# Patient Record
Sex: Female | Born: 1955 | Race: White | Hispanic: No | Smoking: Former smoker
Health system: Southern US, Community
[De-identification: ages and names within clinical notes are randomized; demographics above are authoritative.]

## PROBLEM LIST (undated history)

## (undated) DIAGNOSIS — M549 Dorsalgia, unspecified: Secondary | ICD-10-CM

## (undated) DIAGNOSIS — F419 Anxiety disorder, unspecified: Secondary | ICD-10-CM

## (undated) DIAGNOSIS — R519 Headache, unspecified: Secondary | ICD-10-CM

## (undated) DIAGNOSIS — G4700 Insomnia, unspecified: Secondary | ICD-10-CM

## (undated) DIAGNOSIS — I10 Essential (primary) hypertension: Secondary | ICD-10-CM

## (undated) DIAGNOSIS — I442 Atrioventricular block, complete: Secondary | ICD-10-CM

## (undated) DIAGNOSIS — N939 Abnormal uterine and vaginal bleeding, unspecified: Secondary | ICD-10-CM

## (undated) DIAGNOSIS — Z950 Presence of cardiac pacemaker: Secondary | ICD-10-CM

## (undated) DIAGNOSIS — I480 Paroxysmal atrial fibrillation: Secondary | ICD-10-CM

## (undated) DIAGNOSIS — R0602 Shortness of breath: Secondary | ICD-10-CM

## (undated) DIAGNOSIS — R079 Chest pain, unspecified: Secondary | ICD-10-CM

## (undated) HISTORY — DX: Abnormal uterine and vaginal bleeding, unspecified: N93.9

## (undated) HISTORY — DX: Dorsalgia, unspecified: M54.9

## (undated) HISTORY — DX: Atrioventricular block, complete: I44.2

## (undated) HISTORY — DX: Paroxysmal atrial fibrillation: I48.0

## (undated) HISTORY — DX: Essential (primary) hypertension: I10

## (undated) HISTORY — DX: Headache, unspecified: R51.9

## (undated) HISTORY — DX: Insomnia, unspecified: G47.00

## (undated) HISTORY — DX: Shortness of breath: R06.02

## (undated) HISTORY — DX: Presence of cardiac pacemaker: Z95.0

## (undated) HISTORY — PX: BREAST BIOPSY: SHX20

## (undated) HISTORY — DX: Anxiety disorder, unspecified: F41.9

## (undated) HISTORY — DX: Chest pain, unspecified: R07.9

---

## 1983-05-05 HISTORY — PX: TUBAL LIGATION: SHX77

## 2011-10-20 ENCOUNTER — Other Ambulatory Visit: Payer: Self-pay | Admitting: Family Medicine

## 2011-10-20 DIAGNOSIS — R921 Mammographic calcification found on diagnostic imaging of breast: Secondary | ICD-10-CM

## 2011-10-28 ENCOUNTER — Ambulatory Visit
Admission: RE | Admit: 2011-10-28 | Discharge: 2011-10-28 | Disposition: A | Discharge status: Home / Self Care | Payer: Managed Care, Other (non HMO) | Source: Ambulatory Visit | Attending: Family Medicine | Admitting: Family Medicine

## 2011-10-28 DIAGNOSIS — R921 Mammographic calcification found on diagnostic imaging of breast: Secondary | ICD-10-CM

## 2012-05-02 DIAGNOSIS — J40 Bronchitis, not specified as acute or chronic: Secondary | ICD-10-CM | POA: Insufficient documentation

## 2013-01-10 DIAGNOSIS — I10 Essential (primary) hypertension: Secondary | ICD-10-CM | POA: Insufficient documentation

## 2013-08-16 DIAGNOSIS — R0989 Other specified symptoms and signs involving the circulatory and respiratory systems: Secondary | ICD-10-CM | POA: Insufficient documentation

## 2013-08-16 DIAGNOSIS — G5600 Carpal tunnel syndrome, unspecified upper limb: Secondary | ICD-10-CM | POA: Insufficient documentation

## 2013-08-16 DIAGNOSIS — M722 Plantar fascial fibromatosis: Secondary | ICD-10-CM | POA: Insufficient documentation

## 2014-10-24 DIAGNOSIS — M2550 Pain in unspecified joint: Secondary | ICD-10-CM | POA: Insufficient documentation

## 2015-06-05 HISTORY — PX: CHOLECYSTECTOMY: SHX55

## 2015-10-30 DIAGNOSIS — Z8249 Family history of ischemic heart disease and other diseases of the circulatory system: Secondary | ICD-10-CM | POA: Insufficient documentation

## 2016-01-12 DIAGNOSIS — R1013 Epigastric pain: Secondary | ICD-10-CM | POA: Insufficient documentation

## 2016-01-17 ENCOUNTER — Encounter: Payer: Self-pay | Admitting: Gastroenterology

## 2016-02-12 ENCOUNTER — Ambulatory Visit: Payer: Managed Care, Other (non HMO) | Admitting: Nurse Practitioner

## 2016-09-23 ENCOUNTER — Other Ambulatory Visit: Payer: Self-pay | Admitting: General Surgery

## 2016-09-23 DIAGNOSIS — Z01810 Encounter for preprocedural cardiovascular examination: Secondary | ICD-10-CM

## 2016-10-07 ENCOUNTER — Encounter: Payer: Managed Care, Other (non HMO) | Attending: General Surgery | Admitting: Registered"

## 2016-10-07 ENCOUNTER — Encounter: Payer: Self-pay | Admitting: Registered"

## 2016-10-07 ENCOUNTER — Encounter (HOSPITAL_COMMUNITY)
Admission: RE | Admit: 2016-10-07 | Discharge: 2016-10-07 | Disposition: A | Discharge status: Home / Self Care | Payer: Managed Care, Other (non HMO) | Source: Ambulatory Visit | Attending: General Surgery | Admitting: General Surgery

## 2016-10-07 DIAGNOSIS — E669 Obesity, unspecified: Secondary | ICD-10-CM

## 2016-10-07 DIAGNOSIS — I447 Left bundle-branch block, unspecified: Secondary | ICD-10-CM | POA: Diagnosis not present

## 2016-10-07 DIAGNOSIS — Z713 Dietary counseling and surveillance: Secondary | ICD-10-CM | POA: Diagnosis present

## 2016-10-07 DIAGNOSIS — Z6841 Body Mass Index (BMI) 40.0 and over, adult: Secondary | ICD-10-CM | POA: Insufficient documentation

## 2016-10-07 DIAGNOSIS — Z01810 Encounter for preprocedural cardiovascular examination: Secondary | ICD-10-CM | POA: Diagnosis not present

## 2016-10-07 NOTE — Progress Notes (Signed)
Patient showed up in Admitting and brought in paperwork from [facility] to have EKG done.  EKG done and EKG showed LBBB and NSR.  Patient reports hx of no hypertension or diabetes with EKG in past at [facility] in [place] ( PCP) and Stress Test at [facility].  Patient stated she is being worked up for bariatric surgery.  Patient denied any chest pain for EKG appt but does report some SOB at times.  Patient in no acute distress.  EKG shown to anesthesia Dr Acey Lavarignan.  DR Acey Lavarignan stated that if patient has no history of previous EKG with LBBB patient needs to have cardiac workup prior to surgery.  DR Acey Lavarignan aware medical records requested from [facility] and [facility].  I faxed to [facility] to [phone] the following:  EKG of 10/07/2016 ( unconfirmed) Stress Test 11/15/2015 from [facility]  ( lexiscan) No EKG on file at [facility] for this patient Stress Test from 11/07/2015- which states in report LBBB with exercise. And should repeat with a pharmacologic stress test.   Awaiting EKG if available from [facility].  Will forward to [facility] if arrives.

## 2016-10-07 NOTE — Progress Notes (Signed)
Pre-Op Assessment Visit:  Pre-Operative RYGB Surgery  Medical Nutrition Therapy:  Appt start time: 9:00  End time:  10:10  Patient was seen on 10/07/2016 for Pre-Operative Nutrition Assessment. Assessment and letter of approval [facility] to [facility] coordinator on 10/07/2016.   Pt expectation of surgery: "play with grandchildren, live to see grandchildren graduate college/start family, be a good wife"   Pt expectation of Dietitian: "make it as easy as possible to make the transition to what to eat after surgery"   Start weight at [facility]: 233.8 BMI: 41.42   Pt arrives with husband. Pt states she doesn't like to drink and doesn't drink enough.   Per insurance, pt needs 3 SWL visits prior to surgery.    24 hr Dietary Recall: First Meal: hot dog w/ mustard and onions Snack: none Second Meal: hot dog w/ mustard and onions Snack: none Third Meal: sm baked potato, baked pork chop, green peas Snack: none Beverages: water, sm amt of Sundrop, sweet tea, coffee  Encouraged to engage in 150 minutes of moderate physical activity including cardiovascular and weight baring weekly  Handouts given during visit include:  . Pre-Op Goals . Bariatric Surgery Protein Shakes . Vitamin and Mineral Recommendations  During the appointment today the following Pre-Op Goals were reviewed with the patient: . Maintain or lose weight as instructed by your surgeon . Make healthy food choices . Begin to limit portion sizes . Limited concentrated sugars and fried foods . Keep fat/sugar in the single digits per serving on          food labels . Practice CHEWING your food  (aim for 30 chews per bite or until applesauce consistency) . Practice not drinking 15 minutes before, during, and 30 minutes after each meal/snack . Avoid all carbonated beverages  . Avoid/limit caffeinated beverages  . Avoid all sugar-sweetened beverages . Consume 3 meals per day; eat every 3-5  hours . Make a list of non-food related activities . Aim for 64-100 ounces of FLUID daily  . Aim for at least 60-80 grams of PROTEIN daily . Look for a liquid protein source that contain ?15 g protein and ?5 g carbohydrate  (ex: shakes, drinks, shots) . Physical activity is an important part of a healthy lifestyle so keep it moving!  Follow diet recommendations listed below Energy and Macronutrient Recommendations: Calories: 1600 Carbohydrate: 180 Protein: 120 Fat: 44  Demonstrated degree of understanding via:  Teach Back   Teaching Method Utilized:  Visual Auditory Hands on  Barriers to learning/adherence to lifestyle change: contemplative stage of change  Patient to call the Nutrition [facility] Education Services to enroll in Pre-Op and Post-Op Nutrition Education when surgery date is scheduled.

## 2016-10-20 ENCOUNTER — Encounter: Payer: Managed Care, Other (non HMO) | Admitting: Skilled Nursing Facility1

## 2016-10-20 ENCOUNTER — Ambulatory Visit: Payer: Managed Care, Other (non HMO) | Admitting: Psychiatry

## 2016-10-20 ENCOUNTER — Encounter: Payer: Self-pay | Admitting: Skilled Nursing Facility1

## 2016-10-20 DIAGNOSIS — E669 Obesity, unspecified: Secondary | ICD-10-CM

## 2016-10-20 DIAGNOSIS — Z713 Dietary counseling and surveillance: Secondary | ICD-10-CM | POA: Diagnosis not present

## 2016-10-20 NOTE — Progress Notes (Signed)
  Assessment:   1st SWL Appointment.   Pt states she has been Drinking more water and everything is going well. Pt states chewing is going well and getting over 50 fluid ounces. Pt states she does not trust the sugar alternatives.  Pt states she is working with a psychologist for her fears like getting the surgery and it not helping with her arthritic pain.   Start Wt at [facility]: 233.8 Wt: 236 BMI: 41.81  MEDICATIONS: See List   DIETARY INTAKE:  24 hr Dietary Recall: First Meal: hot dog w/ mustard and onions Snack: none Second Meal: hot dog w/ mustard and onions Snack: none Third Meal: salmon and cantelope  Snack: none Beverages: water, sm amt of Sundrop, sweet tea, coffee  Demonstrated degree of understanding via:  Teach Back  Encouraged to engage in 150 minutes of moderate physical activity including cardiovascular and weight baring weekly  Goals: -Keep working on getting enough fluid, Great Work! -Keep aware of how your life is going to change -Try to cut back more on eating fast food -Look for higher grams of fiber for your bread  -Try frying your egg in olive oil  Follow diet recommendations listed below Energy and Macronutrient Recommendations: Calories: 1600 Carbohydrate: 180 Protein: 120 Fat: 44  Demonstrated degree of understanding via:  Teach Back   Teaching Method Utilized:  Visual Auditory Hands on  Barriers to learning/adherence to lifestyle change: contemplative stage of change  Patient to call the Nutrition and Diabetes Education Services to enroll in Pre-Op and Post-Op Nutrition Education when surgery date is scheduled.

## 2016-10-20 NOTE — Patient Instructions (Addendum)
-  Keep working on getting enough fluid, Randie HeinzGreat Work!  -Keep aware of how your life is going to change  -Try to cut back more on eating fast food  -Look for higher grams of fiber for your bread   -Try frying your egg in olive oil

## 2016-11-16 ENCOUNTER — Encounter: Payer: Self-pay | Admitting: Skilled Nursing Facility1

## 2016-11-16 ENCOUNTER — Encounter: Payer: Managed Care, Other (non HMO) | Attending: General Surgery | Admitting: Skilled Nursing Facility1

## 2016-11-16 DIAGNOSIS — E669 Obesity, unspecified: Secondary | ICD-10-CM

## 2016-11-16 DIAGNOSIS — Z713 Dietary counseling and surveillance: Secondary | ICD-10-CM | POA: Insufficient documentation

## 2016-11-16 NOTE — Progress Notes (Signed)
  Assessment:   2nd SWL Appointment.   Pt states she does not trust the sugar alternatives.  Pt states she is working with a psychologist for her fears like getting the surgery and it not helping with her arthritic pain.   Pt arrives having lost 4 pounds. Pt states her fluid has increased and she feels good about that. Pt states she does better when at work verses home. Pt sates after she chews the food well enough it starts to not taste so good anymore. Pt states she is still working on not drinking with meals. Pt states she walks a couple miles a day.   Start Wt at [facility]: 233.8 Wt: 232 BMI: 41.19  MEDICATIONS: See List   DIETARY INTAKE:  24 hr Dietary Recall: First Meal: hamburger meat Snack: 2 cheesestick Second Meal: skipped Snack: banana  [place] yogurt Third Meal: salmon and broccoli and cheese and backed poato and corn bread Snack: none Beverages: water, sm amt of diet Sundrop, coffee  Demonstrated degree of understanding via:  Teach Back  Encouraged to engage in 150 minutes of moderate physical activity including cardiovascular and weight baring weekly  Goals: -Keep working on getting enough fluid, Great Work! -Keep aware of how your life is going to change -Try to cut back more on eating fast food -Look for higher grams of fiber for your bread  -Try frying your egg in olive oil  Follow diet recommendations listed below Energy and Macronutrient Recommendations: Calories: 1600 Carbohydrate: 180 Protein: 120 Fat: 44  Demonstrated degree of understanding via:  Teach Back   Teaching Method Utilized:  Visual Auditory Hands on  Barriers to learning/adherence to lifestyle change: contemplative stage of change  Patient to call the Nutrition and Diabetes Education Services to enroll in Pre-Op and Post-Op Nutrition Education when surgery date is scheduled.

## 2016-12-01 ENCOUNTER — Ambulatory Visit (INDEPENDENT_AMBULATORY_CARE_PROVIDER_SITE_OTHER): Payer: Managed Care, Other (non HMO) | Admitting: Psychiatry

## 2016-12-01 DIAGNOSIS — F509 Eating disorder, unspecified: Secondary | ICD-10-CM

## 2016-12-04 DIAGNOSIS — G2581 Restless legs syndrome: Secondary | ICD-10-CM | POA: Insufficient documentation

## 2016-12-17 ENCOUNTER — Encounter: Payer: Self-pay | Admitting: Skilled Nursing Facility1

## 2016-12-17 ENCOUNTER — Encounter: Payer: Managed Care, Other (non HMO) | Attending: General Surgery | Admitting: Skilled Nursing Facility1

## 2016-12-17 DIAGNOSIS — E669 Obesity, unspecified: Secondary | ICD-10-CM

## 2016-12-17 DIAGNOSIS — Z713 Dietary counseling and surveillance: Secondary | ICD-10-CM | POA: Diagnosis not present

## 2016-12-17 DIAGNOSIS — Z6841 Body Mass Index (BMI) 40.0 and over, adult: Secondary | ICD-10-CM | POA: Insufficient documentation

## 2016-12-17 NOTE — Progress Notes (Signed)
  Assessment:   3rd SWL Appointment.   Pt states she does not trust the sugar alternatives.  Pt states she is working with a psychologist for her fears like getting the surgery and it not helping with her arthritic pain.   Pt arrives having gained about 2 pounds. Pt states it was a birthday week so she feels she has overindulged but other than that has been good with less soda and more water. Pt states her husband has dementia which has caused her a lot of stress.   Start Wt at [facility]: 233.8 Wt: 234 BMI: 41.45  MEDICATIONS: See List   DIETARY INTAKE:  24 hr Dietary Recall: First Meal: hamburger meat Snack: 2 cheesestick Second Meal: skipped Snack: banana  [place] yogurt Third Meal: salmon and broccoli and cheese and backed poato and corn bread Snack: none Beverages: water, sm amt of diet Sundrop, coffee  Demonstrated degree of understanding via:  Teach Back  Encouraged to engage in 150 minutes of moderate physical activity including cardiovascular and weight baring weekly  Goals: -Keep working on getting enough fluid, Great Work! -Keep aware of how your life is going to change -Try to cut back more on eating fast food -Look for higher grams of fiber for your bread  -Try frying your egg in olive oil  Follow diet recommendations listed below Energy and Macronutrient Recommendations: Calories: 1600 Carbohydrate: 180 Protein: 120 Fat: 44  Demonstrated degree of understanding via:  Teach Back   Teaching Method Utilized:  Visual Auditory Hands on  Barriers to learning/adherence to lifestyle change: contemplative stage of change  Patient to call the Nutrition and Diabetes Education Services to enroll in Pre-Op and Post-Op Nutrition Education when surgery date is scheduled.

## 2017-01-11 ENCOUNTER — Ambulatory Visit: Payer: Self-pay

## 2017-01-12 ENCOUNTER — Encounter: Payer: Self-pay | Admitting: Registered"

## 2017-01-12 ENCOUNTER — Encounter: Payer: Managed Care, Other (non HMO) | Attending: General Surgery | Admitting: Registered"

## 2017-01-12 DIAGNOSIS — Z6841 Body Mass Index (BMI) 40.0 and over, adult: Secondary | ICD-10-CM | POA: Insufficient documentation

## 2017-01-12 DIAGNOSIS — Z713 Dietary counseling and surveillance: Secondary | ICD-10-CM | POA: Insufficient documentation

## 2017-01-12 DIAGNOSIS — E669 Obesity, unspecified: Secondary | ICD-10-CM

## 2017-01-12 NOTE — Progress Notes (Signed)
Assessment: 4th SWL Appointment.  Start time: 5:00              End time: 5:20   Pt arrives having lost 2.8 lbs from previous visit. Pt states she has been stressed due to dementia progressing with husband. Pt states she thought she was having a heart attack at the end of June, and now needs a appt with cardiologist prior to having surgery. Pt states she needs more visits with us due to insurance requiring 90 days of nutritional visits.   Pt states she is getting the RYGB.   Pt states she does not trust the sugar alternatives.  Pt states she is working with a psychologist for her fears like getting the surgery and it not helping with her arthritic pain.    Start Wt at [facility]: 233.8 Wt: 231.2 BMI: 40.96  MEDICATIONS: See List   DIETARY INTAKE:  24 hr Dietary Recall: First Meal: chips and guacamole  Snack: none Second Meal: chips and guacamole  Snack: none Third Meal: SmartOne Malaw[place]urkey, dressing, potatoes, cole slaw or salmon and broccoli and cheese and backed poato and corn bread Snack: none Beverages: water, sm amt of diet Sundrop, coffee  Demonstrated degree of understanding via:  Teach Back   Encouraged to engage in 150 minutes of moderate physical activity including cardiovascular and weight baring weekly  Goals: - Track fluid intake to help monitor and aim for 64 fluid ounces a day.  - Begin reducing caffeine/coffee intake. - Aim for 3 meals a day.  Follow diet recommendations listed below Energy and Macronutrient Recommendations: Calories: 1600 Carbohydrate: 180 Protein: 120 Fat: 44  Demonstrated degree of understanding via:  Teach Back   Teaching Method Utilized:  Visual Auditory Hands on  Barriers to learning/adherence to lifestyle change: contemplative stage of change  Patient to call the Nutrition and Diabetes Education Services to enroll in Pre-Op and Post-Op Nutrition Education when surgery date is scheduled.

## 2017-01-12 NOTE — Patient Instructions (Addendum)
-   Track fluid intake to help monitor and aim for 64 fluid ounces a day.   - Begin reducing caffeine/coffee intake.  - Aim for 3 meals a day.

## 2018-09-27 ENCOUNTER — Telehealth: Payer: Self-pay | Admitting: *Deleted

## 2018-09-27 NOTE — Telephone Encounter (Signed)
I called the pt and LVM asking if she would like to come into the office for her visit on Wed 6/3 vs. VV if she is not comfortable coming in. Left office number for call back.  COVID risk score 2.

## 2018-10-03 NOTE — Telephone Encounter (Signed)
I spoke with the pt. She stated that d/t work conflict, she needed to cancel her 6/3 appt and she would call back to r/s. She also had a question about her husband who is a pt of Lucia Gaskins (she is on his DPR) which will be documented in his chart.

## 2018-10-05 ENCOUNTER — Ambulatory Visit: Payer: Managed Care, Other (non HMO) | Admitting: Neurology

## 2018-10-19 DIAGNOSIS — R911 Solitary pulmonary nodule: Secondary | ICD-10-CM | POA: Insufficient documentation

## 2019-03-20 HISTORY — PX: PACEMAKER IMPLANT: EP1218

## 2019-03-20 MED ORDER — Medication
2.00 | Status: DC
Start: 2019-03-20 — End: 2019-03-20

## 2019-03-20 MED ORDER — PYRILAMINE TAN-PHENYLEPH TAN 30-5 MG/5ML PO SUSP
50.00 | ORAL | Status: DC
Start: ? — End: 2019-03-20

## 2019-03-20 MED ORDER — HYDROCODONE-ACETAMINOPHEN 5-325 MG PO TABS
1.00 | ORAL_TABLET | ORAL | Status: DC
Start: ? — End: 2019-03-20

## 2019-03-20 MED ORDER — Medication
30.00 | Status: DC
Start: ? — End: 2019-03-20

## 2019-03-21 ENCOUNTER — Ambulatory Visit: Payer: Managed Care, Other (non HMO) | Admitting: Cardiology

## 2019-10-30 DIAGNOSIS — E785 Hyperlipidemia, unspecified: Secondary | ICD-10-CM | POA: Insufficient documentation

## 2020-10-14 DIAGNOSIS — I4891 Unspecified atrial fibrillation: Secondary | ICD-10-CM | POA: Insufficient documentation

## 2020-10-14 DIAGNOSIS — G4733 Obstructive sleep apnea (adult) (pediatric): Secondary | ICD-10-CM | POA: Insufficient documentation

## 2020-10-14 DIAGNOSIS — F411 Generalized anxiety disorder: Secondary | ICD-10-CM | POA: Insufficient documentation

## 2021-06-04 DIAGNOSIS — N838 Other noninflammatory disorders of ovary, fallopian tube and broad ligament: Secondary | ICD-10-CM | POA: Insufficient documentation

## 2021-09-02 DIAGNOSIS — G4730 Sleep apnea, unspecified: Secondary | ICD-10-CM | POA: Diagnosis not present

## 2021-09-02 DIAGNOSIS — R739 Hyperglycemia, unspecified: Secondary | ICD-10-CM | POA: Diagnosis not present

## 2021-09-02 DIAGNOSIS — I10 Essential (primary) hypertension: Secondary | ICD-10-CM | POA: Diagnosis not present

## 2021-09-02 DIAGNOSIS — Z6841 Body Mass Index (BMI) 40.0 and over, adult: Secondary | ICD-10-CM | POA: Diagnosis not present

## 2021-09-02 DIAGNOSIS — Z8679 Personal history of other diseases of the circulatory system: Secondary | ICD-10-CM | POA: Diagnosis not present

## 2021-09-02 DIAGNOSIS — N838 Other noninflammatory disorders of ovary, fallopian tube and broad ligament: Secondary | ICD-10-CM | POA: Diagnosis not present

## 2021-09-02 DIAGNOSIS — E7849 Other hyperlipidemia: Secondary | ICD-10-CM | POA: Diagnosis not present

## 2021-09-02 DIAGNOSIS — E782 Mixed hyperlipidemia: Secondary | ICD-10-CM | POA: Diagnosis not present

## 2021-09-02 DIAGNOSIS — R69 Illness, unspecified: Secondary | ICD-10-CM | POA: Diagnosis not present

## 2021-09-04 DIAGNOSIS — Z1231 Encounter for screening mammogram for malignant neoplasm of breast: Secondary | ICD-10-CM | POA: Diagnosis not present

## 2021-09-05 ENCOUNTER — Other Ambulatory Visit (HOSPITAL_COMMUNITY): Payer: Self-pay | Admitting: Family Medicine

## 2021-09-05 ENCOUNTER — Other Ambulatory Visit: Payer: Self-pay | Admitting: Family Medicine

## 2021-09-05 DIAGNOSIS — N838 Other noninflammatory disorders of ovary, fallopian tube and broad ligament: Secondary | ICD-10-CM

## 2021-09-12 ENCOUNTER — Ambulatory Visit (HOSPITAL_COMMUNITY)
Admission: RE | Admit: 2021-09-12 | Discharge: 2021-09-12 | Disposition: A | Discharge status: Home / Self Care | Payer: 59 | Source: Ambulatory Visit | Attending: Family Medicine | Admitting: Family Medicine

## 2021-09-12 DIAGNOSIS — N838 Other noninflammatory disorders of ovary, fallopian tube and broad ligament: Secondary | ICD-10-CM | POA: Diagnosis not present

## 2021-09-14 DIAGNOSIS — Z1211 Encounter for screening for malignant neoplasm of colon: Secondary | ICD-10-CM | POA: Diagnosis not present

## 2021-09-14 DIAGNOSIS — Z1212 Encounter for screening for malignant neoplasm of rectum: Secondary | ICD-10-CM | POA: Diagnosis not present

## 2021-09-26 ENCOUNTER — Ambulatory Visit (INDEPENDENT_AMBULATORY_CARE_PROVIDER_SITE_OTHER): Payer: 59 | Admitting: Cardiology

## 2021-09-26 ENCOUNTER — Encounter: Payer: Self-pay | Admitting: *Deleted

## 2021-09-26 ENCOUNTER — Encounter: Payer: Self-pay | Admitting: Cardiology

## 2021-09-26 VITALS — BP 140/78 | HR 79 | Ht 63.0 in | Wt 227.4 lb

## 2021-09-26 DIAGNOSIS — I4891 Unspecified atrial fibrillation: Secondary | ICD-10-CM

## 2021-09-26 DIAGNOSIS — R0602 Shortness of breath: Secondary | ICD-10-CM

## 2021-09-26 DIAGNOSIS — D6869 Other thrombophilia: Secondary | ICD-10-CM

## 2021-09-26 DIAGNOSIS — Z950 Presence of cardiac pacemaker: Secondary | ICD-10-CM | POA: Diagnosis not present

## 2021-09-26 DIAGNOSIS — I442 Atrioventricular block, complete: Secondary | ICD-10-CM

## 2021-09-26 MED ORDER — METOPROLOL TARTRATE 75 MG PO TABS: 75.0000 mg | ORAL_TABLET | Freq: Two times a day (BID) | ORAL | 6 refills | Status: DC

## 2021-09-26 NOTE — Progress Notes (Signed)
Clinical Summary Theresa Neal is a 66 y.o.female seen today as a new patient.   Prevoiusly followed by [facility]ology at [facility]   Afib - occasional palpitations, few times a week - typically at rest, heart pounding. Lasts a few minutes - compliant with meds - no bleeding on xarelto.    2.Complete heart block with pacemaker - from notes medtronic BiV pacemaker, CRT-P - no recent symptoms  3. HTN - compliant with meds   4. OSA - follwed by [facility]   5. SOB - comes and goes.  - reports symptoms in October while walking at the beach. Some associated chest pains at that time - former smoker x 20-30years.    - 1-2 months ago [place] with grandchildren. Significant DOE. Heaviness in chest, does not recall location of chest pain. Lasted a few minutes.  - [facility], reprts PFTs there - no recent edema.      Husband passed in October Upcoming cruise to [place] with her family   Past Medical History:  Diagnosis Date   Anxiety    Back pain    Chest pain    Complete heart block (HCC)    Headache    Hypertension    Insomnia    Pacemaker    PAF (paroxysmal atrial fibrillation) (HCC)    SOB (shortness of breath)    Vaginal bleeding      No Known Allergies   Current Outpatient Medications  Medication Sig Dispense Refill   ALPRAZolam (XANAX) 0.5 MG tablet Take by mouth.     ergocalciferol (VITAMIN D2) 1.25 MG (50000 UT) capsule Take by mouth.     estrogen, conjugated,-medroxyprogesterone (PREMPRO) 0.3-1.5 MG tablet Take 1 tablet by mouth daily.     hydrOXYzine (ATARAX) 25 MG tablet hydroxyzine HCl 25 mg tablet     metoprolol tartrate (LOPRESSOR) 50 MG tablet TAKE 1 TABLET BY MOUTH EVERY MORNING AND TAKE 1 TABLET BEFORE bedtime     omeprazole (PRILOSEC) 10 MG capsule Take 10 mg by mouth daily.     XARELTO 20 MG TABS tablet Take 20 mg by mouth daily.     zolpidem (AMBIEN CR) 12.5 MG CR tablet Take 12.5 mg by mouth  at bedtime as needed for sleep.     No current facility-administered medications for this visit.     Past Surgical History:  Procedure Laterality Date   BREAST BIOPSY Left    benign   CHOLECYSTECTOMY  06/2015   TUBAL LIGATION  1985     No Known Allergies    No family history on file.   Social History Ms. Schuenemann reports that she has quit smoking. Her smoking use included cigarettes. She does not have any smokeless tobacco history on file. Ms. Donalson has no history on file for alcohol use.   Review of Systems CONSTITUTIONAL: No weight loss, fever, chills, weakness or fatigue.  HEENT: Eyes: No visual loss, blurred vision, double vision or yellow sclerae.No hearing loss, sneezing, congestion, runny nose or sore throat.  SKIN: No rash or itching.  CARDIOVASCULAR: per hpi RESPIRATORY: No shortness of breath, cough or sputum.  GASTROINTESTINAL: No anorexia, nausea, vomiting or diarrhea. No abdominal pain or blood.  GENITOURINARY: No burning on urination, no polyuria NEUROLOGICAL: No headache, dizziness, syncope, paralysis, ataxia, numbness or tingling in the extremities. No change in bowel or bladder control.  MUSCULOSKELETAL: No muscle, back pain, joint pain or stiffness.  LYMPHATICS: No enlarged nodes. No history of splenectomy.  PSYCHIATRIC:  No history of depression or anxiety.  ENDOCRINOLOGIC: No reports of sweating, cold or heat intolerance. No polyuria or polydipsia.  Marland Kitchen   Physical Examination Today's Vitals   09/26/21 1321  BP: 140/78  Pulse: 79  SpO2: 98%  Weight: 227 lb 6.4 oz (103.1 kg)  Height: 5\' 3"  (1.6 m)   Body mass index is 40.28 kg/m.  Gen: resting comfortably, no acute distress HEENT: no scleral icterus, pupils equal round and reactive, no palptable cervical adenopathy,  CV: RRR, no m/r/g no jvd Resp: Clear to auscultation bilaterally GI: abdomen is soft, non-tender, non-distended, normal bowel sounds, no hepatosplenomegaly MSK: extremities are  warm, no edema.  Skin: warm, no rash Neuro:  no focal deficits Psych: appropriate affect   Diagnostic Studies 11/2019 echo 1. Overall left ventricular ejection fraction is estimated at 55 to 60%.   2. Normal global left ventricular systolic function.   3. Normal left ventricular diastolic filling.   4. There is no evidence of pericardial effusion.   5. Mild mitral annular calcification.   6. Limited TTE.   7. No intracardiac thrombi, mass or vegetations.  '  03/2019 cMRI  1. 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 No evidence of left ventricular intracavitary thrombi or myocardial scar/fibrosis, edema/inflammation, diffuse myocardial infiltration or increased iron content.   2. Normal right ventricular cavity size and systolic function without conclusive evidence of late contrast enhancement or features of arrhythmogenic dysplasia.   3. Moderate left atrial and mild right atrial dilatation.  03/2019 echo  Summary   1. Overall left ventricular ejection fraction is estimated at 45 to 50%.   2. Mildly decreased global left ventricular systolic function.   3. Mild mitral valve regurgitation.   4. Mild thickening of the anterior and posterior mitral valve leaflets.   11/2019 nuclear stress IMPRESSION:  SPECT imaging shows normal perfusion and function.  EF is normal at >65% (visually).  No transient ischemic dilatation.  Paced rhythm.  No extracardiac tracer uptake noted.  Septal and lateral hot spots suggestive of Left ventricular hypertrophy.     Assessment and Plan  1.Afib/acquried thrombophilia - palpitations at times, increase lopressor to 75mg  bid - continue xarelto for stroke prevention  2.Complete heart block/Pacemaker - has medtrocinic BiV pacemaker, will have her establish in device clinic - no symptoms  3. HTN - elevated here, increase lopressor which may have some effect. If  needed can increase losartan at f/u  4.SOB - unclear etiology - obtain echo, possibly coronary CTA pending recho results - she reports prior pulmonary eval with PFTs at [facility] , request records and results.    , M.D.

## 2021-09-26 NOTE — Patient Instructions (Addendum)
Medication Instructions:  Increase Lopressor to 75mg  twice a day   Continue all other medications.     Labwork: none  Testing/Procedures: Your physician has requested that you have an echocardiogram. Echocardiography is a painless test that uses sound waves to create images of your heart. It provides your doctor with information about the size and shape of your heart and how well your heart's chambers and valves are working. This procedure takes approximately one hour. There are no restrictions for this procedure.  Office will contact with results via phone or letter.     Follow-Up: 6 months   Any Other Special Instructions Will Be Listed Below (If Applicable).   If you need a refill on your cardiac medications before your next appointment, please call your pharmacy.

## 2021-09-30 ENCOUNTER — Encounter: Payer: Self-pay | Admitting: *Deleted

## 2021-10-06 ENCOUNTER — Ambulatory Visit (INDEPENDENT_AMBULATORY_CARE_PROVIDER_SITE_OTHER): Payer: 59

## 2021-10-06 DIAGNOSIS — R0602 Shortness of breath: Secondary | ICD-10-CM | POA: Diagnosis not present

## 2021-10-06 LAB — ECHOCARDIOGRAM COMPLETE
AR max vel: 1.65 cm2
AV Area VTI: 2.25 cm2
AV Area mean vel: 1.89 cm2
AV Mean grad: 3.7 mmHg
AV Peak grad: 8.9 mmHg
Ao pk vel: 1.49 m/s
Area-P 1/2: 3.6 cm2
Calc EF: 66 %
MV M vel: 2.77 m/s
MV Peak grad: 30.8 mmHg
S' Lateral: 2.64 cm
Single Plane A2C EF: 66.8 %
Single Plane A4C EF: 62.8 %

## 2021-11-07 ENCOUNTER — Ambulatory Visit: Payer: 59 | Admitting: Internal Medicine

## 2021-11-07 ENCOUNTER — Encounter: Payer: Self-pay | Admitting: Internal Medicine

## 2021-11-07 VITALS — BP 102/60 | HR 62 | Ht 63.0 in | Wt 224.2 lb

## 2021-11-07 DIAGNOSIS — I480 Paroxysmal atrial fibrillation: Secondary | ICD-10-CM

## 2021-11-07 DIAGNOSIS — I10 Essential (primary) hypertension: Secondary | ICD-10-CM | POA: Diagnosis not present

## 2021-11-07 DIAGNOSIS — I442 Atrioventricular block, complete: Secondary | ICD-10-CM | POA: Diagnosis not present

## 2021-11-07 DIAGNOSIS — D6869 Other thrombophilia: Secondary | ICD-10-CM | POA: Diagnosis not present

## 2021-11-07 LAB — CUP PACEART INCLINIC DEVICE CHECK
Battery Remaining Longevity: 98 mo
Battery Voltage: 2.99 V
Brady Statistic AP VP Percent: 23.24 %
Brady Statistic AP VS Percent: 0.01 %
Brady Statistic AS VP Percent: 76.74 %
Brady Statistic AS VS Percent: 0.01 %
Brady Statistic RA Percent Paced: 22.99 %
Brady Statistic RV Percent Paced: 99.97 %
Date Time Interrogation Session: 20230707122400
Implantable Lead Implant Date: 20201116
Implantable Lead Implant Date: 20201116
Implantable Lead Implant Date: 20201116
Implantable Lead Location: 753858
Implantable Lead Location: 753859
Implantable Lead Location: 753860
Implantable Lead Model: 4798
Implantable Lead Model: 5076
Implantable Lead Model: 5076
Implantable Pulse Generator Implant Date: 20201116
Lead Channel Impedance Value: 342 Ohm
Lead Channel Impedance Value: 380 Ohm
Lead Channel Impedance Value: 380 Ohm
Lead Channel Impedance Value: 418 Ohm
Lead Channel Impedance Value: 475 Ohm
Lead Channel Impedance Value: 475 Ohm
Lead Channel Impedance Value: 551 Ohm
Lead Channel Impedance Value: 551 Ohm
Lead Channel Impedance Value: 608 Ohm
Lead Channel Impedance Value: 646 Ohm
Lead Channel Impedance Value: 703 Ohm
Lead Channel Impedance Value: 722 Ohm
Lead Channel Impedance Value: 722 Ohm
Lead Channel Impedance Value: 741 Ohm
Lead Channel Pacing Threshold Amplitude: 0.5 V
Lead Channel Pacing Threshold Amplitude: 0.5 V
Lead Channel Pacing Threshold Amplitude: 2 V
Lead Channel Pacing Threshold Pulse Width: 0.4 ms
Lead Channel Pacing Threshold Pulse Width: 0.4 ms
Lead Channel Pacing Threshold Pulse Width: 0.4 ms
Lead Channel Sensing Intrinsic Amplitude: 0.875 mV
Lead Channel Sensing Intrinsic Amplitude: 1 mV
Lead Channel Sensing Intrinsic Amplitude: 23.875 mV
Lead Channel Sensing Intrinsic Amplitude: 23.875 mV
Lead Channel Setting Pacing Amplitude: 1.5 V
Lead Channel Setting Pacing Amplitude: 2 V
Lead Channel Setting Pacing Amplitude: 2.5 V
Lead Channel Setting Pacing Pulse Width: 0.4 ms
Lead Channel Setting Pacing Pulse Width: 0.4 ms
Lead Channel Setting Sensing Sensitivity: 1.2 mV

## 2021-11-07 NOTE — Progress Notes (Signed)
Theresa Moris, PA-C: Primary Cardiologist:  Dr Neila Gear is a 66 y.o. female with a h/o complete heart block sp CRT PPM (MDT) in [facility] who presents today to establish care in the Electrophysiology device clinic.   The patient reports doing very well since having a pacemaker implanted and remains very active despite her age.  She has SOB with moderate activity. Today, she  denies symptoms of palpitations, chest pain, orthopnea, PND, lower extremity edema, dizziness, presyncope, syncope, or neurologic sequela.  The patientis tolerating medications without difficulties and is otherwise without complaint today.   Past Medical History:  Diagnosis Date   Anxiety    Back pain    Chest pain    Complete heart block (HCC)    Headache    Hypertension    Insomnia    Pacemaker    PAF (paroxysmal atrial fibrillation) (HCC)    SOB (shortness of breath)    Vaginal bleeding    Past Surgical History:  Procedure Laterality Date   BREAST BIOPSY Left    benign   CHOLECYSTECTOMY  06/2015   PACEMAKER IMPLANT Left 03/20/2019   MDT Percepta Quad CRT-P implanted in [facility] by Dr Silvio Clayman for complete heart block   TUBAL LIGATION  1985    Social History   Socioeconomic History   Marital status: Widowed    Spouse name: Not on file   Number of children: Not on file   Years of education: Not on file   Highest education level: Not on file  Occupational History   Not on file  Tobacco Use   Smoking status: Former    Types: Cigarettes   Smokeless tobacco: Never  Vaping Use   Vaping Use: Never used  Substance and Sexual Activity   Alcohol use: Yes   Drug use: Never   Sexual activity: Yes  Other Topics Concern   Not on file  Social History Narrative   Not on file   Social Determinants of Health   Financial Resource Strain: Not on file  Food Insecurity: Not on file  Transportation Needs: Not on file  Physical Activity: Not on file  Stress: Not on file  Social  Connections: Not on file  Intimate Partner Violence: Not on file    Family History  Problem Relation Age of Onset   Lung disease Mother     No Known Allergies  Current Outpatient Medications  Medication Sig Dispense Refill   Aspirin-Acetaminophen-Caffeine (GOODY HEADACHE PO) Take 1 each by mouth as needed.     losartan (COZAAR) 50 MG tablet Take 50 mg by mouth daily.     metoprolol tartrate 75 MG TABS Take 75 mg by mouth 2 (two) times daily. 60 tablet 6   XARELTO 20 MG TABS tablet Take 20 mg by mouth daily.     No current facility-administered medications for this visit.    ROS- all systems are reviewed and negative except as per HPI  Physical Exam: Vitals:   11/07/21 1025  BP: 102/60  Pulse: 62  SpO2: 98%  Weight: 224 lb 3.2 oz (101.7 kg)  Height: 5\' 3"  (1.6 m)    GEN- The patient is well appearing, alert and oriented x 3 today.   Head- normocephalic, atraumatic Eyes-  Sclera clear, conjunctiva pink Ears- hearing intact Oropharynx- clear Neck- supple, no JVP Lymph- no cervical lymphadenopathy Lungs- Clear to ausculation bilaterally, normal work of breathing Chest- pacemaker pocket is well healed Heart- Regular rate and rhythm, no murmurs, rubs  or gallops, PMI not laterally displaced GI- soft, NT, ND, + BS Extremities- no clubbing, cyanosis, or edema MS- no significant deformity or atrophy Skin- no rash or lesion Psych- euthymic mood, full affect Neuro- strength and sensation are intact  Pacemaker interrogation- reviewed in detail today,  See PACEART report  Echo 10/06/21- EF 60%, normal RV size/ function  Assessment and Plan:  Complete heart block Normal biv pacemaker function See Pace Art report No changes today she is device dependant today Enroll in remote monitoring  2. HTN Stable No change required today  3. Paroxysmal atrial fibrillation Burden by PPM is <1 %  On xarelto for chads2vasc score of at least 2  Follow-up with Dr Wyline Mood as  scheduled Return in a year We will follow her PPM remotely in the interim.

## 2021-11-07 NOTE — Patient Instructions (Signed)
Medication Instructions:  Continue all current medications.  Labwork: none  Testing/Procedures: none  Follow-Up: 1 year - Dr.  Allred   Any Other Special Instructions Will Be Listed Below (If Applicable).   If you need a refill on your cardiac medications before your next appointment, please call your pharmacy.  

## 2021-11-14 ENCOUNTER — Telehealth: Payer: Self-pay | Admitting: Cardiology

## 2021-11-14 MED ORDER — METOPROLOL TARTRATE 50 MG PO TABS: 50.0000 mg | ORAL_TABLET | Freq: Once | ORAL | 0 refills | Status: DC

## 2021-11-14 NOTE — Patient Instructions (Addendum)
   Your cardiac CT will be scheduled at one of the below locations:   [facility] [address] [place], [place] [address] [phone]   If scheduled at [facility], please arrive at the [facility] En[facility]ance (Entrance C2) of [facility] 30 minutes prior to test start time. You can use the FREE valet parking offered at entrance C (encouraged to control the heart rate for the test)  Proceed to the [facility] Radiology Department (first floor) to check-in and test prep.  All radiology patients and guests should use entrance C2 at [facility], accessed from [facility], even though the hospital's physical address listed is [address]    Please follow these instructions carefully (unless otherwise directed):  On the Night Before the Test: Be sure to Drink plenty of water. Do not consume any caffeinated/decaffeinated beverages or chocolate 12 hours prior to your test. Do not take any antihistamines 12 hours prior to your test.  On the Day of the Test: Drink plenty of water until 1 hour prior to the test. Do not eat any food 4 hours prior to the test. You may take your regular medications prior to the test.  Take metoprolol tartrate 50 mg tablet by mouth two hours prior to test. FEMALES- please wear underwire-free bra if available, avoid dresses & tight clothing      After the Test: Drink plenty of water. After receiving IV contrast, you may experience a mild flushed feeling. This is normal. On occasion, you may experience a mild rash up to 24 hours after the test. This is not dangerous. If this occurs, you can take Benadryl 25 mg and increase your fluid intake. If you experience trouble breathing, this can be serious. If it is severe call 911 IMMEDIATELY. If it is mild, please call our office.  We will call to schedule your test 2-4 weeks out understanding that some insurance companies will need an  authorization prior to the service being performed.   For non-scheduling related questions, please contact the cardiac imaging nurse navigator should you have any questions/concerns: Rockwell Alexandria, Cardiac Imaging Nurse Navigator Larey Brick, Cardiac Imaging Nurse Navigator  Heart and Vascular Services Direct Office Dial: [phone]   For scheduling needs, including cancellations and rescheduling, please call [place], [phone].

## 2021-11-14 NOTE — Telephone Encounter (Signed)
Patient states she is returning a call from [place], [place].

## 2021-11-14 NOTE — Telephone Encounter (Signed)
Addressed in results encounter. 

## 2021-12-04 ENCOUNTER — Telehealth (HOSPITAL_COMMUNITY): Payer: Self-pay | Admitting: Emergency Medicine

## 2021-12-04 NOTE — Telephone Encounter (Signed)
Reaching out to patient to offer assistance regarding upcoming cardiac imaging study; pt verbalizes understanding of appt date/time, parking situation and where to check in, pre-test NPO status and medications ordered, and verified current allergies; name and call back number provided for further questions should they arise Rockwell Alexandria RN Navigator Cardiac Imaging [facility] [phone] office [phone] cell   Arrival 130 w/c entrance  50mg  metoprolol tartrate  Holding losartan Denies iv issues Aware of nitro

## 2021-12-05 ENCOUNTER — Ambulatory Visit (HOSPITAL_COMMUNITY)
Admission: RE | Admit: 2021-12-05 | Discharge: 2021-12-05 | Disposition: A | Discharge status: Home / Self Care | Payer: 59 | Source: Ambulatory Visit | Attending: Cardiology | Admitting: Cardiology

## 2021-12-05 ENCOUNTER — Other Ambulatory Visit: Payer: Self-pay | Admitting: Cardiology

## 2021-12-05 DIAGNOSIS — R0602 Shortness of breath: Secondary | ICD-10-CM | POA: Diagnosis present

## 2021-12-05 DIAGNOSIS — R931 Abnormal findings on diagnostic imaging of heart and coronary circulation: Secondary | ICD-10-CM | POA: Insufficient documentation

## 2021-12-05 DIAGNOSIS — I2510 Atherosclerotic heart disease of native coronary artery without angina pectoris: Secondary | ICD-10-CM | POA: Diagnosis not present

## 2021-12-05 MED ORDER — NITROGLYCERIN 0.4 MG SL SUBL
0.8000 mg | SUBLINGUAL_TABLET | Freq: Once | SUBLINGUAL | Status: AC
  Administered 2021-12-05: 0.8 mg via SUBLINGUAL

## 2021-12-05 MED ORDER — NITROGLYCERIN 0.4 MG SL SUBL
SUBLINGUAL_TABLET | SUBLINGUAL | Status: AC
  Filled 2021-12-05: qty 2

## 2021-12-05 MED ORDER — IOHEXOL 350 MG/ML SOLN
100.0000 mL | Freq: Once | INTRAVENOUS | Status: AC | PRN
  Administered 2021-12-05: 100 mL via INTRAVENOUS

## 2021-12-06 ENCOUNTER — Ambulatory Visit (HOSPITAL_COMMUNITY)
Admission: RE | Admit: 2021-12-06 | Discharge: 2021-12-06 | Disposition: A | Discharge status: Home / Self Care | Payer: 59 | Source: Ambulatory Visit | Attending: Cardiology | Admitting: Cardiology

## 2021-12-06 ENCOUNTER — Ambulatory Visit (HOSPITAL_BASED_OUTPATIENT_CLINIC_OR_DEPARTMENT_OTHER)
Admission: RE | Admit: 2021-12-06 | Discharge: 2021-12-06 | Disposition: A | Discharge status: Home / Self Care | Payer: 59 | Source: Ambulatory Visit | Attending: Cardiology | Admitting: Cardiology

## 2021-12-06 DIAGNOSIS — R931 Abnormal findings on diagnostic imaging of heart and coronary circulation: Secondary | ICD-10-CM | POA: Diagnosis not present

## 2021-12-16 ENCOUNTER — Ambulatory Visit (INDEPENDENT_AMBULATORY_CARE_PROVIDER_SITE_OTHER): Payer: 59

## 2021-12-16 DIAGNOSIS — I442 Atrioventricular block, complete: Secondary | ICD-10-CM | POA: Diagnosis not present

## 2021-12-16 LAB — CUP PACEART REMOTE DEVICE CHECK
Battery Remaining Longevity: 98 mo
Battery Voltage: 2.99 V
Brady Statistic AP VP Percent: 37.34 %
Brady Statistic AP VS Percent: 0.01 %
Brady Statistic AS VP Percent: 62.63 %
Brady Statistic AS VS Percent: 0.01 %
Brady Statistic RA Percent Paced: 37.18 %
Brady Statistic RV Percent Paced: 99.97 %
Date Time Interrogation Session: 20230815044103
Implantable Lead Implant Date: 20201116
Implantable Lead Implant Date: 20201116
Implantable Lead Implant Date: 20201116
Implantable Lead Location: 753858
Implantable Lead Location: 753859
Implantable Lead Location: 753860
Implantable Lead Model: 4798
Implantable Lead Model: 5076
Implantable Lead Model: 5076
Implantable Pulse Generator Implant Date: 20201116
Lead Channel Impedance Value: 304 Ohm
Lead Channel Impedance Value: 342 Ohm
Lead Channel Impedance Value: 361 Ohm
Lead Channel Impedance Value: 380 Ohm
Lead Channel Impedance Value: 456 Ohm
Lead Channel Impedance Value: 456 Ohm
Lead Channel Impedance Value: 532 Ohm
Lead Channel Impedance Value: 551 Ohm
Lead Channel Impedance Value: 589 Ohm
Lead Channel Impedance Value: 608 Ohm
Lead Channel Impedance Value: 684 Ohm
Lead Channel Impedance Value: 684 Ohm
Lead Channel Impedance Value: 703 Ohm
Lead Channel Impedance Value: 703 Ohm
Lead Channel Pacing Threshold Amplitude: 0.5 V
Lead Channel Pacing Threshold Amplitude: 0.5 V
Lead Channel Pacing Threshold Amplitude: 1.625 V
Lead Channel Pacing Threshold Pulse Width: 0.4 ms
Lead Channel Pacing Threshold Pulse Width: 0.4 ms
Lead Channel Pacing Threshold Pulse Width: 0.4 ms
Lead Channel Sensing Intrinsic Amplitude: 0.375 mV
Lead Channel Sensing Intrinsic Amplitude: 0.375 mV
Lead Channel Sensing Intrinsic Amplitude: 23.875 mV
Lead Channel Sensing Intrinsic Amplitude: 23.875 mV
Lead Channel Setting Pacing Amplitude: 1.5 V
Lead Channel Setting Pacing Amplitude: 2 V
Lead Channel Setting Pacing Amplitude: 2.25 V
Lead Channel Setting Pacing Pulse Width: 0.4 ms
Lead Channel Setting Pacing Pulse Width: 0.4 ms
Lead Channel Setting Sensing Sensitivity: 1.2 mV

## 2021-12-31 DIAGNOSIS — R030 Elevated blood-pressure reading, without diagnosis of hypertension: Secondary | ICD-10-CM | POA: Diagnosis not present

## 2021-12-31 DIAGNOSIS — R42 Dizziness and giddiness: Secondary | ICD-10-CM | POA: Diagnosis not present

## 2021-12-31 DIAGNOSIS — Z6839 Body mass index (BMI) 39.0-39.9, adult: Secondary | ICD-10-CM | POA: Diagnosis not present

## 2021-12-31 DIAGNOSIS — R5383 Other fatigue: Secondary | ICD-10-CM | POA: Diagnosis not present

## 2021-12-31 DIAGNOSIS — J069 Acute upper respiratory infection, unspecified: Secondary | ICD-10-CM | POA: Diagnosis not present

## 2021-12-31 DIAGNOSIS — I480 Paroxysmal atrial fibrillation: Secondary | ICD-10-CM | POA: Diagnosis not present

## 2022-01-01 ENCOUNTER — Other Ambulatory Visit: Payer: Self-pay | Admitting: Family Medicine

## 2022-01-01 DIAGNOSIS — R42 Dizziness and giddiness: Secondary | ICD-10-CM

## 2022-01-02 ENCOUNTER — Ambulatory Visit
Admission: RE | Admit: 2022-01-02 | Discharge: 2022-01-02 | Disposition: A | Discharge status: Home / Self Care | Payer: 59 | Source: Ambulatory Visit | Attending: Family Medicine | Admitting: Family Medicine

## 2022-01-02 DIAGNOSIS — H538 Other visual disturbances: Secondary | ICD-10-CM | POA: Diagnosis not present

## 2022-01-02 DIAGNOSIS — R519 Headache, unspecified: Secondary | ICD-10-CM | POA: Diagnosis not present

## 2022-01-02 DIAGNOSIS — R42 Dizziness and giddiness: Secondary | ICD-10-CM | POA: Diagnosis not present

## 2022-01-02 DIAGNOSIS — R413 Other amnesia: Secondary | ICD-10-CM | POA: Diagnosis not present

## 2022-01-16 NOTE — Progress Notes (Signed)
Remote pacemaker transmission.   

## 2022-01-28 ENCOUNTER — Other Ambulatory Visit: Payer: 59

## 2022-02-02 DIAGNOSIS — M722 Plantar fascial fibromatosis: Secondary | ICD-10-CM | POA: Diagnosis not present

## 2022-02-02 DIAGNOSIS — M25569 Pain in unspecified knee: Secondary | ICD-10-CM | POA: Diagnosis not present

## 2022-02-02 DIAGNOSIS — R030 Elevated blood-pressure reading, without diagnosis of hypertension: Secondary | ICD-10-CM | POA: Diagnosis not present

## 2022-02-02 DIAGNOSIS — M25559 Pain in unspecified hip: Secondary | ICD-10-CM | POA: Diagnosis not present

## 2022-02-02 DIAGNOSIS — M2550 Pain in unspecified joint: Secondary | ICD-10-CM | POA: Diagnosis not present

## 2022-02-02 DIAGNOSIS — Z6838 Body mass index (BMI) 38.0-38.9, adult: Secondary | ICD-10-CM | POA: Diagnosis not present

## 2022-03-17 ENCOUNTER — Ambulatory Visit (INDEPENDENT_AMBULATORY_CARE_PROVIDER_SITE_OTHER): Payer: 59

## 2022-03-17 DIAGNOSIS — I442 Atrioventricular block, complete: Secondary | ICD-10-CM

## 2022-03-17 LAB — CUP PACEART REMOTE DEVICE CHECK
Battery Remaining Longevity: 88 mo
Battery Voltage: 2.99 V
Brady Statistic AP VP Percent: 30.97 %
Brady Statistic AP VS Percent: 0.01 %
Brady Statistic AS VP Percent: 69 %
Brady Statistic AS VS Percent: 0.02 %
Brady Statistic RA Percent Paced: 30.8 %
Brady Statistic RV Percent Paced: 99.97 %
Date Time Interrogation Session: 20231114015233
Implantable Lead Connection Status: 753985
Implantable Lead Connection Status: 753985
Implantable Lead Connection Status: 753985
Implantable Lead Implant Date: 20201116
Implantable Lead Implant Date: 20201116
Implantable Lead Implant Date: 20201116
Implantable Lead Location: 753858
Implantable Lead Location: 753859
Implantable Lead Location: 753860
Implantable Lead Model: 4798
Implantable Lead Model: 5076
Implantable Lead Model: 5076
Implantable Pulse Generator Implant Date: 20201116
Lead Channel Impedance Value: 304 Ohm
Lead Channel Impedance Value: 342 Ohm
Lead Channel Impedance Value: 399 Ohm
Lead Channel Impedance Value: 437 Ohm
Lead Channel Impedance Value: 437 Ohm
Lead Channel Impedance Value: 456 Ohm
Lead Channel Impedance Value: 513 Ohm
Lead Channel Impedance Value: 551 Ohm
Lead Channel Impedance Value: 608 Ohm
Lead Channel Impedance Value: 627 Ohm
Lead Channel Impedance Value: 646 Ohm
Lead Channel Impedance Value: 665 Ohm
Lead Channel Impedance Value: 703 Ohm
Lead Channel Impedance Value: 722 Ohm
Lead Channel Pacing Threshold Amplitude: 0.5 V
Lead Channel Pacing Threshold Amplitude: 0.625 V
Lead Channel Pacing Threshold Amplitude: 2.25 V
Lead Channel Pacing Threshold Pulse Width: 0.4 ms
Lead Channel Pacing Threshold Pulse Width: 0.4 ms
Lead Channel Pacing Threshold Pulse Width: 0.4 ms
Lead Channel Sensing Intrinsic Amplitude: 1 mV
Lead Channel Sensing Intrinsic Amplitude: 1 mV
Lead Channel Sensing Intrinsic Amplitude: 14.5 mV
Lead Channel Sensing Intrinsic Amplitude: 14.5 mV
Lead Channel Setting Pacing Amplitude: 1.5 V
Lead Channel Setting Pacing Amplitude: 2 V
Lead Channel Setting Pacing Amplitude: 2.75 V
Lead Channel Setting Pacing Pulse Width: 0.4 ms
Lead Channel Setting Pacing Pulse Width: 0.4 ms
Lead Channel Setting Sensing Sensitivity: 1.2 mV
Zone Setting Status: 755011
Zone Setting Status: 755011

## 2022-04-03 DIAGNOSIS — J209 Acute bronchitis, unspecified: Secondary | ICD-10-CM | POA: Diagnosis not present

## 2022-04-03 DIAGNOSIS — Z6836 Body mass index (BMI) 36.0-36.9, adult: Secondary | ICD-10-CM | POA: Diagnosis not present

## 2022-04-03 DIAGNOSIS — R030 Elevated blood-pressure reading, without diagnosis of hypertension: Secondary | ICD-10-CM | POA: Diagnosis not present

## 2022-04-13 ENCOUNTER — Other Ambulatory Visit: Payer: Self-pay | Admitting: Cardiology

## 2022-04-15 NOTE — Progress Notes (Signed)
Remote pacemaker transmission.   

## 2022-05-29 ENCOUNTER — Other Ambulatory Visit: Payer: Self-pay

## 2022-05-29 ENCOUNTER — Emergency Department (HOSPITAL_COMMUNITY): Payer: 59

## 2022-05-29 ENCOUNTER — Emergency Department (HOSPITAL_COMMUNITY)
Admission: EM | Admit: 2022-05-29 | Discharge: 2022-05-29 | Disposition: A | Discharge status: Home / Self Care | Payer: 59 | Attending: Emergency Medicine | Admitting: Emergency Medicine

## 2022-05-29 ENCOUNTER — Encounter (HOSPITAL_COMMUNITY): Payer: Self-pay | Admitting: *Deleted

## 2022-05-29 DIAGNOSIS — I509 Heart failure, unspecified: Secondary | ICD-10-CM | POA: Diagnosis not present

## 2022-05-29 DIAGNOSIS — I442 Atrioventricular block, complete: Secondary | ICD-10-CM | POA: Insufficient documentation

## 2022-05-29 DIAGNOSIS — Z7901 Long term (current) use of anticoagulants: Secondary | ICD-10-CM | POA: Insufficient documentation

## 2022-05-29 DIAGNOSIS — R0602 Shortness of breath: Secondary | ICD-10-CM | POA: Diagnosis not present

## 2022-05-29 DIAGNOSIS — R002 Palpitations: Secondary | ICD-10-CM | POA: Diagnosis not present

## 2022-05-29 LAB — CBC WITH DIFFERENTIAL/PLATELET
Abs Immature Granulocytes: 0.01 10*3/uL (ref 0.00–0.07)
Basophils Absolute: 0 10*3/uL (ref 0.0–0.1)
Basophils Relative: 1 %
Eosinophils Absolute: 0 10*3/uL (ref 0.0–0.5)
Eosinophils Relative: 1 %
HCT: 37.2 % (ref 36.0–46.0)
Hemoglobin: 13 g/dL (ref 12.0–15.0)
Immature Granulocytes: 0 %
Lymphocytes Relative: 28 %
Lymphs Abs: 0.9 10*3/uL (ref 0.7–4.0)
MCH: 33.4 pg (ref 26.0–34.0)
MCHC: 34.9 g/dL (ref 30.0–36.0)
MCV: 95.6 fL (ref 80.0–100.0)
Monocytes Absolute: 0.4 10*3/uL (ref 0.1–1.0)
Monocytes Relative: 11 %
Neutro Abs: 2.1 10*3/uL (ref 1.7–7.7)
Neutrophils Relative %: 59 %
Platelets: 141 10*3/uL — ABNORMAL LOW (ref 150–400)
RBC: 3.89 MIL/uL (ref 3.87–5.11)
RDW: 13.6 % (ref 11.5–15.5)
WBC: 3.4 10*3/uL — ABNORMAL LOW (ref 4.0–10.5)
nRBC: 0 % (ref 0.0–0.2)

## 2022-05-29 LAB — COMPREHENSIVE METABOLIC PANEL
ALT: 23 U/L (ref 0–44)
AST: 29 U/L (ref 15–41)
Albumin: 3.9 g/dL (ref 3.5–5.0)
Alkaline Phosphatase: 46 U/L (ref 38–126)
Anion gap: 9 (ref 5–15)
BUN: 14 mg/dL (ref 8–23)
CO2: 18 mmol/L — ABNORMAL LOW (ref 22–32)
Calcium: 8.4 mg/dL — ABNORMAL LOW (ref 8.9–10.3)
Chloride: 111 mmol/L (ref 98–111)
Creatinine, Ser: 0.9 mg/dL (ref 0.44–1.00)
GFR, Estimated: >60 mL/min (ref >60)
Glucose, Bld: 125 mg/dL — ABNORMAL HIGH (ref 70–99)
Potassium: 3.7 mmol/L (ref 3.5–5.1)
Sodium: 138 mmol/L (ref 135–145)
Total Bilirubin: 0.5 mg/dL (ref 0.3–1.2)
Total Protein: 6.9 g/dL (ref 6.5–8.1)

## 2022-05-29 LAB — BRAIN NATRIURETIC PEPTIDE: B Natriuretic Peptide: 58 pg/mL (ref 0.0–100.0)

## 2022-05-29 LAB — TROPONIN I (HIGH SENSITIVITY): Troponin I (High Sensitivity): 7 ng/L (ref <18)

## 2022-05-29 LAB — TSH: TSH: 1.752 u[IU]/mL (ref 0.350–4.500)

## 2022-05-29 NOTE — ED Provider Notes (Signed)
[facility] Provider Note   CSN: [id number] Arrival date & time: 05/29/22  4098     History  Chief Complaint  Patient presents with   Palpitations    Theresa Neal is a 67 y.o. female.   Palpitations  This patient is a 67 year old female, she has a history of complete heart block status post pacemaker about 5 years ago, she is on Xarelto and metoprolol and no other significant medications.  She had been on losartan in the past as well but it is unclear if she is still taking that.  She follows with cardiology Dr. Harl Bowie, she states that this morning at 5:30 AM after she had already woken up she felt acute onset of palpitations and shortness of breath.  She was not feeling this yesterday.  She has no fevers or coughing or swelling of the legs.  She states that the symptoms have been persistent throughout the morning, she has not taken anything for this, she does not use oxygen at home, no history of lung disease    Home Medications Prior to Admission medications   Medication Sig Start Date End Date Taking? Authorizing Provider  Aspirin-Acetaminophen-Caffeine (GOODY HEADACHE PO) Take 1 each by mouth as needed.    [provider]  losartan (COZAAR) 50 MG tablet Take 50 mg by mouth daily. 09/01/21   [provider]  metoprolol tartrate (LOPRESSOR) 25 MG tablet TAKE 3 TABLETS (75MG ) BY MOUTH TWICE DAILY 04/13/22   Arnoldo Lenis, MD  metoprolol tartrate (LOPRESSOR) 50 MG tablet Take 1 tablet (50 mg total) by mouth once for 1 dose. 11/14/21 11/14/21  Arnoldo Lenis, MD  XARELTO 20 MG TABS tablet Take 20 mg by mouth daily. 09/01/21   [provider]      Allergies    Patient has no known allergies.    Review of Systems   Review of Systems  Cardiovascular:  Positive for palpitations.  All other systems reviewed and are negative.   Physical Exam Updated Vital Signs BP (!) 145/106   Pulse 86   Temp 99.6 F  (37.6 C) (Oral)   Resp 16   Ht 1.6 m (5\' 3" )   Wt 90.3 kg   SpO2 100%   BMI 35.25 kg/m  Physical Exam Vitals and nursing note reviewed.  Constitutional:      General: She is not in acute distress.    Appearance: She is well-developed.  HENT:     Head: Normocephalic and atraumatic.     Mouth/Throat:     Pharynx: No oropharyngeal exudate.  Eyes:     General: No scleral icterus.       Right eye: No discharge.        Left eye: No discharge.     Conjunctiva/sclera: Conjunctivae normal.     Pupils: Pupils are equal, round, and reactive to light.  Neck:     Thyroid: No thyromegaly.     Vascular: No JVD.  Cardiovascular:     Rate and Rhythm: Normal rate and regular rhythm.     Heart sounds: Normal heart sounds. No murmur heard.    No friction rub. No gallop.     Comments: Pacemaker pocket in the left upper chest wall appears clear, no tenderness or redness overlying this area Pulmonary:     Effort: Pulmonary effort is normal. No respiratory distress.     Breath sounds: Normal breath sounds. No wheezing or rales.  Abdominal:  General: Bowel sounds are normal. There is no distension.     Palpations: Abdomen is soft. There is no mass.     Tenderness: There is no abdominal tenderness.  Musculoskeletal:        General: No tenderness. Normal range of motion.     Cervical back: Normal range of motion and neck supple.     Right lower leg: No edema.     Left lower leg: No edema.  Lymphadenopathy:     Cervical: No cervical adenopathy.  Skin:    General: Skin is warm and dry.     Findings: No erythema or rash.  Neurological:     Mental Status: She is alert.     Coordination: Coordination normal.  Psychiatric:        Behavior: Behavior normal.     ED Results / Procedures / Treatments   Labs (all labs ordered are listed, but only abnormal results are displayed) Labs Reviewed  CBC WITH DIFFERENTIAL/PLATELET - Abnormal; Notable for the following components:      Result Value    WBC 3.4 (*)    Platelets 141 (*)    All other components within normal limits  COMPREHENSIVE METABOLIC PANEL - Abnormal; Notable for the following components:   CO2 18 (*)    Glucose, Bld 125 (*)    Calcium 8.4 (*)    All other components within normal limits  TSH  BRAIN NATRIURETIC PEPTIDE  TROPONIN I (HIGH SENSITIVITY)    EKG EKG Interpretation  Date/Time:  Friday May 29 2022 08:24:26 EST Ventricular Rate:  61 PR Interval:  154 QRS Duration: 124 QT Interval:  472 QTC Calculation: 476 R Axis:   266 Text Interpretation: Sinus rhythm Right bundle branch block Anterior infarct, old ST abnormality - non specific Baseline wander in lead(s) V3 V4 V5 compared with 2018, no changes Confirmed by Noemi Chapel [phone]) on 05/29/2022 8:30:13 AM  Radiology DG Chest Port 1 View  Result Date: 05/29/2022 CLINICAL DATA:  Palpitations. EXAM: PORTABLE CHEST 1 VIEW COMPARISON:  Report from chest x-ray are October 26, 2016 (images not available). FINDINGS: The heart size and mediastinal contours are within normal limits. Left subclavian approach cardiac rhythm maintenance device. Both lungs are clear. No visible pleural effusions or pneumothorax. No acute osseous abnormality. IMPRESSION: No active disease. Electronically Signed   By: Margaretha Sheffield M.D.   On: 05/29/2022 08:52    Procedures Procedures    Medications Ordered in ED Medications - No data to display  ED Course/ Medical Decision Making/ A&P                             Medical Decision Making Amount and/or Complexity of Data Reviewed Labs: ordered. Radiology: ordered.   This patient presents to the ED for concern of shortness of breath and palpitations, this involves an extensive number of treatment options, and is a complaint that carries with it a high risk of complications and morbidity.  The differential diagnosis includes pacemaker dysfunction, lead fracture, pulmonary embolism seems less likely given that she is on  Xarelto, could be heart failure, electrolyte abnormalities or hyperthyroidism   Co morbidities that complicate the patient evaluation  Mild CHF, echo 60 to 65% 7 months ago, grade 1 diastolic dysfunction   Additional history obtained:  Additional history obtained from echocardiogram and CT angiogram of the coronaries External records from outside source obtained and reviewed including no significant hemodynamically significant lesions, only mild  CHF Reviewed medical history from when the patient had a pacemaker placed in 2020 it was in [facility], 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  The pertinent results include: CBC metabolic panel TSH all unremarkable   Imaging Studies ordered:  I ordered imaging studies including chest x-ray I independently visualized and interpreted imaging which showed no acute findings I agree with the radiologist interpretation   Cardiac Monitoring: / EKG:  The patient was maintained on a cardiac monitor.  I personally viewed and interpreted the cardiac monitored which showed an underlying rhythm of: Paced rhythm, no ischemia or significant arrhythmia, no PVCs or PACs, no pathologic arrhythmia   Problem List / ED Course / Critical interventions / Medication management  No symptoms after arrival, patient states she wants to go home, she has mild hypertension but states that she takes her medication at night, has not missed any doses of her Xarelto or blood pressure meds, she is not tachycardic and has no findings on monitor of any arrhythmias of concern, low risk for PE given her Xarelto use  I have reviewed the patients home medicines and have made adjustments as needed   Social Determinants of Health:  None   Test / Admission - Considered:  Considered admission but the patient has no pathologic arrhythmias, she is appropriately paced and appears hemodynamically stable, could have early  viral syndrome but no acute findings or need to treat anything acutely.         Final Clinical Impression(s) / ED Diagnoses Final diagnoses:  Palpitations    Rx / DC Orders ED Discharge Orders     None         Eber Hong, MD 05/29/22 [phone]

## 2022-05-29 NOTE — Discharge Instructions (Signed)
Your testing today did not show any signs of anything that would cause shortness of breath or palpitations.  Your EKG has been reassuring, your pacemaker is working appropriately, I do want you to return to the ER immediately for severe worsening symptoms.  Thank you for allowing [place] to treat you in the emergency department today.  After reviewing your examination and potential testing that was done it appears that you are safe to go home.  I would like for you to follow-up with your doctor within the next several days, have them obtain your results and follow-up with them to review all of these tests.  If you should develop severe or worsening symptoms return to the emergency department immediately

## 2022-05-29 NOTE — ED Notes (Signed)
Pt states she has a Medtronic pacemaker, pacemaker interrogated

## 2022-05-29 NOTE — ED Triage Notes (Signed)
Pt states she woke up around 5:30 this am with some discomfort in her chest  Pt has a hx of a fib and has a pacemaker

## 2022-06-02 ENCOUNTER — Encounter: Payer: Self-pay | Admitting: Cardiovascular Disease

## 2022-06-16 ENCOUNTER — Ambulatory Visit: Payer: 59

## 2022-06-16 DIAGNOSIS — I442 Atrioventricular block, complete: Secondary | ICD-10-CM

## 2022-06-16 LAB — CUP PACEART REMOTE DEVICE CHECK
Battery Remaining Longevity: 92 mo
Battery Voltage: 2.99 V
Brady Statistic AP VP Percent: 10.59 %
Brady Statistic AP VS Percent: 0.01 %
Brady Statistic AS VP Percent: 89.39 %
Brady Statistic AS VS Percent: 0.01 %
Brady Statistic RA Percent Paced: 10.51 %
Brady Statistic RV Percent Paced: 99.98 %
Date Time Interrogation Session: 20240213031246
Implantable Lead Connection Status: 753985
Implantable Lead Connection Status: 753985
Implantable Lead Connection Status: 753985
Implantable Lead Implant Date: 20201116
Implantable Lead Implant Date: 20201116
Implantable Lead Implant Date: 20201116
Implantable Lead Location: 753858
Implantable Lead Location: 753859
Implantable Lead Location: 753860
Implantable Lead Model: 4798
Implantable Lead Model: 5076
Implantable Lead Model: 5076
Implantable Pulse Generator Implant Date: 20201116
Lead Channel Impedance Value: 323 Ohm
Lead Channel Impedance Value: 342 Ohm
Lead Channel Impedance Value: 399 Ohm
Lead Channel Impedance Value: 437 Ohm
Lead Channel Impedance Value: 456 Ohm
Lead Channel Impedance Value: 475 Ohm
Lead Channel Impedance Value: 494 Ohm
Lead Channel Impedance Value: 532 Ohm
Lead Channel Impedance Value: 551 Ohm
Lead Channel Impedance Value: 665 Ohm
Lead Channel Impedance Value: 703 Ohm
Lead Channel Impedance Value: 703 Ohm
Lead Channel Impedance Value: 760 Ohm
Lead Channel Impedance Value: 798 Ohm
Lead Channel Pacing Threshold Amplitude: 0.5 V
Lead Channel Pacing Threshold Amplitude: 0.5 V
Lead Channel Pacing Threshold Amplitude: 2 V
Lead Channel Pacing Threshold Pulse Width: 0.4 ms
Lead Channel Pacing Threshold Pulse Width: 0.4 ms
Lead Channel Pacing Threshold Pulse Width: 0.4 ms
Lead Channel Sensing Intrinsic Amplitude: 0.875 mV
Lead Channel Sensing Intrinsic Amplitude: 0.875 mV
Lead Channel Sensing Intrinsic Amplitude: 20.125 mV
Lead Channel Sensing Intrinsic Amplitude: 20.125 mV
Lead Channel Setting Pacing Amplitude: 1.5 V
Lead Channel Setting Pacing Amplitude: 2 V
Lead Channel Setting Pacing Amplitude: 2.5 V
Lead Channel Setting Pacing Pulse Width: 0.4 ms
Lead Channel Setting Pacing Pulse Width: 0.4 ms
Lead Channel Setting Sensing Sensitivity: 1.2 mV
Zone Setting Status: 755011
Zone Setting Status: 755011

## 2022-06-19 DIAGNOSIS — J309 Allergic rhinitis, unspecified: Secondary | ICD-10-CM | POA: Diagnosis not present

## 2022-06-19 DIAGNOSIS — Z6837 Body mass index (BMI) 37.0-37.9, adult: Secondary | ICD-10-CM | POA: Diagnosis not present

## 2022-06-19 DIAGNOSIS — R030 Elevated blood-pressure reading, without diagnosis of hypertension: Secondary | ICD-10-CM | POA: Diagnosis not present

## 2022-06-19 DIAGNOSIS — R69 Illness, unspecified: Secondary | ICD-10-CM | POA: Diagnosis not present

## 2022-07-20 NOTE — Progress Notes (Signed)
Remote pacemaker transmission.   

## 2022-07-24 ENCOUNTER — Other Ambulatory Visit (HOSPITAL_COMMUNITY): Payer: Self-pay | Admitting: Family Medicine

## 2022-07-24 DIAGNOSIS — N838 Other noninflammatory disorders of ovary, fallopian tube and broad ligament: Secondary | ICD-10-CM

## 2022-07-24 DIAGNOSIS — R102 Pelvic and perineal pain: Secondary | ICD-10-CM

## 2022-08-05 ENCOUNTER — Ambulatory Visit (HOSPITAL_COMMUNITY)
Admission: RE | Admit: 2022-08-05 | Discharge: 2022-08-05 | Disposition: A | Discharge status: Home / Self Care | Payer: 59 | Source: Ambulatory Visit | Attending: Family Medicine | Admitting: Family Medicine

## 2022-08-05 DIAGNOSIS — R102 Pelvic and perineal pain: Secondary | ICD-10-CM | POA: Diagnosis not present

## 2022-08-05 DIAGNOSIS — N838 Other noninflammatory disorders of ovary, fallopian tube and broad ligament: Secondary | ICD-10-CM | POA: Diagnosis not present

## 2022-09-11 DIAGNOSIS — Z008 Encounter for other general examination: Secondary | ICD-10-CM | POA: Diagnosis not present

## 2022-09-15 ENCOUNTER — Ambulatory Visit (INDEPENDENT_AMBULATORY_CARE_PROVIDER_SITE_OTHER): Payer: 59

## 2022-09-15 DIAGNOSIS — I442 Atrioventricular block, complete: Secondary | ICD-10-CM

## 2022-09-16 LAB — CUP PACEART REMOTE DEVICE CHECK
Battery Remaining Longevity: 88 mo
Battery Voltage: 2.98 V
Brady Statistic AP VP Percent: 9.92 %
Brady Statistic AP VS Percent: 0.01 %
Brady Statistic AS VP Percent: 90.04 %
Brady Statistic AS VS Percent: 0.03 %
Brady Statistic RA Percent Paced: 9.84 %
Brady Statistic RV Percent Paced: 99.96 %
Date Time Interrogation Session: 20240514044617
Implantable Lead Connection Status: 753985
Implantable Lead Connection Status: 753985
Implantable Lead Connection Status: 753985
Implantable Lead Implant Date: 20201116
Implantable Lead Implant Date: 20201116
Implantable Lead Implant Date: 20201116
Implantable Lead Location: 753858
Implantable Lead Location: 753859
Implantable Lead Location: 753860
Implantable Lead Model: 4798
Implantable Lead Model: 5076
Implantable Lead Model: 5076
Implantable Pulse Generator Implant Date: 20201116
Lead Channel Impedance Value: 323 Ohm
Lead Channel Impedance Value: 342 Ohm
Lead Channel Impedance Value: 399 Ohm
Lead Channel Impedance Value: 418 Ohm
Lead Channel Impedance Value: 456 Ohm
Lead Channel Impedance Value: 456 Ohm
Lead Channel Impedance Value: 456 Ohm
Lead Channel Impedance Value: 513 Ohm
Lead Channel Impedance Value: 532 Ohm
Lead Channel Impedance Value: 627 Ohm
Lead Channel Impedance Value: 665 Ohm
Lead Channel Impedance Value: 684 Ohm
Lead Channel Impedance Value: 722 Ohm
Lead Channel Impedance Value: 741 Ohm
Lead Channel Pacing Threshold Amplitude: 0.5 V
Lead Channel Pacing Threshold Amplitude: 0.625 V
Lead Channel Pacing Threshold Amplitude: 2 V
Lead Channel Pacing Threshold Pulse Width: 0.4 ms
Lead Channel Pacing Threshold Pulse Width: 0.4 ms
Lead Channel Pacing Threshold Pulse Width: 0.4 ms
Lead Channel Sensing Intrinsic Amplitude: 1 mV
Lead Channel Sensing Intrinsic Amplitude: 1 mV
Lead Channel Sensing Intrinsic Amplitude: 20.125 mV
Lead Channel Sensing Intrinsic Amplitude: 20.125 mV
Lead Channel Setting Pacing Amplitude: 1.5 V
Lead Channel Setting Pacing Amplitude: 2 V
Lead Channel Setting Pacing Amplitude: 2.5 V
Lead Channel Setting Pacing Pulse Width: 0.4 ms
Lead Channel Setting Pacing Pulse Width: 0.4 ms
Lead Channel Setting Sensing Sensitivity: 1.2 mV
Zone Setting Status: 755011
Zone Setting Status: 755011

## 2022-09-18 DIAGNOSIS — I10 Essential (primary) hypertension: Secondary | ICD-10-CM | POA: Diagnosis not present

## 2022-09-18 DIAGNOSIS — J309 Allergic rhinitis, unspecified: Secondary | ICD-10-CM | POA: Diagnosis not present

## 2022-09-18 DIAGNOSIS — F411 Generalized anxiety disorder: Secondary | ICD-10-CM | POA: Diagnosis not present

## 2022-09-18 DIAGNOSIS — I480 Paroxysmal atrial fibrillation: Secondary | ICD-10-CM | POA: Diagnosis not present

## 2022-09-18 DIAGNOSIS — Z6836 Body mass index (BMI) 36.0-36.9, adult: Secondary | ICD-10-CM | POA: Diagnosis not present

## 2022-09-18 DIAGNOSIS — Z8679 Personal history of other diseases of the circulatory system: Secondary | ICD-10-CM | POA: Diagnosis not present

## 2022-10-06 ENCOUNTER — Ambulatory Visit: Payer: 59 | Attending: Nurse Practitioner | Admitting: Nurse Practitioner

## 2022-10-06 ENCOUNTER — Encounter: Payer: Self-pay | Admitting: Nurse Practitioner

## 2022-10-06 VITALS — BP 138/82 | HR 79 | Ht 63.0 in | Wt 213.4 lb

## 2022-10-06 DIAGNOSIS — R0602 Shortness of breath: Secondary | ICD-10-CM

## 2022-10-06 DIAGNOSIS — Z950 Presence of cardiac pacemaker: Secondary | ICD-10-CM

## 2022-10-06 DIAGNOSIS — I480 Paroxysmal atrial fibrillation: Secondary | ICD-10-CM | POA: Diagnosis not present

## 2022-10-06 DIAGNOSIS — I2510 Atherosclerotic heart disease of native coronary artery without angina pectoris: Secondary | ICD-10-CM

## 2022-10-06 DIAGNOSIS — G4733 Obstructive sleep apnea (adult) (pediatric): Secondary | ICD-10-CM

## 2022-10-06 DIAGNOSIS — I442 Atrioventricular block, complete: Secondary | ICD-10-CM | POA: Diagnosis not present

## 2022-10-06 DIAGNOSIS — I10 Essential (primary) hypertension: Secondary | ICD-10-CM

## 2022-10-06 DIAGNOSIS — E669 Obesity, unspecified: Secondary | ICD-10-CM | POA: Diagnosis not present

## 2022-10-06 NOTE — Patient Instructions (Signed)
Medication Instructions:   Continue all current medications.   Labwork:  none  Testing/Procedures:  none  Follow-Up:  Your physician wants you to follow up in:  1 year.  You should receive a recall letter in the mail about 2 months prior to the time you are due.  If you don't receive this, please call our office to schedule your follow up appointment.      Any Other Special Instructions Will Be Listed Below (If Applicable).  Salty six Mediterranean diet  If you need a refill on your cardiac medications before your next appointment, please call your pharmacy.

## 2022-10-06 NOTE — Progress Notes (Signed)
Office Visit    Patient Name: Theresa Neal Date of Encounter: 10/06/2022  PCP:  Dell Ponto   [facility]  Cardiologist:  Dina Rich, MD  Advanced Practice Provider:  Sharlene Dory, NP Electrophysiologist:  Maurice Small, MD [id number]}  Chief Complaint    Theresa Neal is a 67 y.o. female with a CAD, hx of palpitations, CHB, s/p BiV PPM, hypertension, A-fib, OSA, intermittent shortness of breath, who presents today for ED follow-up.    Past Medical History    Past Medical History:  Diagnosis Date   Anxiety    Back pain    Chest pain    Complete heart block (HCC)    Headache    Hypertension    Insomnia    Pacemaker    PAF (paroxysmal atrial fibrillation) (HCC)    SOB (shortness of breath)    Vaginal bleeding    Past Surgical History:  Procedure Laterality Date   BREAST BIOPSY Left    benign   CHOLECYSTECTOMY  06/2015   PACEMAKER IMPLANT Left 03/20/2019   MDT Percepta Quad CRT-P implanted in [facility] by Dr Silvio Clayman for complete heart block   TUBAL LIGATION  1985    Allergies  No Known Allergies  History of Present Illness    Theresa Neal is a 67 y.o. female with a PMH as mentioned above.  Previously followed by cardiology team at [facility] in [place], [place].  Last seen by Dr. Dina Rich on Sep 26, 2021.  At that time, she noted palpitations occasionally, Lopressor was increased to 75 mg twice daily.  Did note shortness of breath, unclear etiology.  Echocardiogram was arranged-benign.  CCTA was arranged-coronary artery calcium score 29, noncalcified plaque in mid LAD with moderate stenosis (50 to 69%), CT FFR 0.84 across lesion-not hemodynamically significant.  See full report below.  Visit at [facility], ED in January 2024 for palpitations and acute onset of shortness of breath.  EKG showed sinus rhythm, right bundle branch block, old ST abnormality.  Given she was having no pathological arrhythmias and was  hemodynamically stable, there were no acute findings or anything to treat acutely, was discharged in stable condition.  Today she presents for scheduled follow-up.  She states she is doing well.  Continues to admit to stable, chronic shortness of breath with extreme activities, attributes this to her weight. Denies any chest pain, palpitations, syncope, presyncope, dizziness, orthopnea, PND, swelling or significant weight changes, acute bleeding, or claudication.  Drinks alcohol socially on occasion, denies any tobacco or illicit drug use.  FH: She says several siblings are on cholesterol medication, no other significant family history of CVD.  EKGs/Labs/Other Studies Reviewed:   The following studies were reviewed today:   EKG:  EKG is not ordered today.   CCTA 12/2021: IMPRESSION: 1. Coronary calcium score of 29. This was 72nd percentile for age and sex matched control.   2. Normal coronary origin with left dominance.   3. Noncalcified plaque in mid LAD causes moderate (50-69%) stenosis.   4. Distal LCX poorly visualized due to artifact from BiV pacemaker lead   5. Will send study for CTFFR   CAD-RADS 3. Moderate stenosis. Consider symptom-guided anti-ischemic pharmacotherapy as well as risk factor modification per guideline directed care. Additional analysis with CT FFR will be submitted. IMPRESSION: 1. CTFFR 0.84 across lesion in mid LAD, suggesting lesion is not hemodynamically significant   2. LCX not modeled due to artifact from device  Echocardiogram 10/2021: 1.  Left ventricular ejection fraction, by estimation, is 60 to 65%. The  left ventricle has normal function. The left ventricle has no regional  wall motion abnormalities. Left ventricular diastolic parameters are  consistent with Grade I diastolic  dysfunction (impaired relaxation). The average left ventricular global  longitudinal strain is -21.4 %. The global longitudinal strain is normal.   2. Right  ventricular systolic function is normal. The right ventricular  size is normal.   3. The mitral valve is normal in structure. Trivial mitral valve  regurgitation. No evidence of mitral stenosis.   4. The aortic valve is tricuspid. Aortic valve regurgitation is not  visualized. No aortic stenosis is present.   5. The inferior vena cava is normal in size with greater than 50%  respiratory variability, suggesting right atrial pressure of 3 mmHg.   Comparison(s): Echocardiogram done 11/30/19 showed an EF of 55-60%.  Recent Labs: 05/29/2022: ALT 23; B Natriuretic Peptide 58.0; BUN 14; Creatinine, Ser 0.90; Hemoglobin 13.0; Platelets 141; Potassium 3.7; Sodium 138; TSH 1.752  Recent Lipid Panel No results found for: "CHOL", "TRIG", "HDL", "CHOLHDL", "VLDL", "LDLCALC", "LDLDIRECT"  Risk Assessment/Calculations:   CHA2DS2-VASc Score = 2  This indicates a 2.2% annual risk of stroke. The patient's score is based upon: CHF History: 0 HTN History: 1 Diabetes History: 0 Stroke History: 0 Vascular Disease History: 0 Age Score: 0 Gender Score: 1      The 10-year ASCVD risk score (Arnett DK, et al., 2019) is: 7.3%   Values used to calculate the score:     Age: 45 years     Sex: Female     Is Non-Hispanic African American: No     Diabetic: No     Tobacco smoker: No     Systolic Blood Pressure: 138 mmHg     Is BP treated: Yes     HDL Cholesterol: 61 mg/dL     Total Cholesterol: 197 mg/dL   Home Medications   Current Meds  Medication Sig   Aspirin-Acetaminophen-Caffeine (GOODY HEADACHE PO) Take 1 each by mouth as needed.   buPROPion ER (WELLBUTRIN SR) 100 MG 12 hr tablet Take 100 mg by mouth. 200mg  every morning & 100mg  every evening   JINTELI 1-5 MG-MCG TABS tablet Take 1 tablet by mouth daily.   losartan (COZAAR) 50 MG tablet Take 50 mg by mouth daily.   metoprolol tartrate (LOPRESSOR) 25 MG tablet TAKE 3 TABLETS (75MG ) BY MOUTH TWICE DAILY   montelukast (SINGULAIR) 10 MG tablet  Take 10 mg by mouth daily.   XARELTO 20 MG TABS tablet Take 20 mg by mouth daily.     Review of Systems    All other systems reviewed and are otherwise negative except as noted above.  Physical Exam    VS:  BP 138/82   Pulse 79   Ht 5\' 3"  (1.6 m)   Wt 213 lb 6.4 oz (96.8 kg)   SpO2 98%   BMI 37.80 kg/m  , BMI Body mass index is 37.8 kg/m.  Wt Readings from Last 3 Encounters:  10/06/22 213 lb 6.4 oz (96.8 kg)  05/29/22 199 lb (90.3 kg)  11/07/21 224 lb 3.2 oz (101.7 kg)     GEN: Obese, 67 year old female in no acute distress. HEENT: normal. Neck: Supple, no JVD, carotid bruits, or masses. Cardiac: S1/S2, RRR, no murmurs, rubs, or gallops. No clubbing, cyanosis, edema.  Radials/PT 2+ and equal bilaterally.  Respiratory:  Respirations regular and unlabored, clear to auscultation bilaterally. GI: Soft, nontender,  nondistended. MS: No deformity or atrophy. Skin: Warm and dry, no rash. Neuro:  Strength and sensation are intact. Psych: Normal affect.  Assessment & Plan    CAD CCTA 1 year ago revealed moderate stenosis along mid LAD, not hemodynamically significant lesion seen with FFR. Stable with no anginal symptoms. No indication for ischemic evaluation.  Not on aspirin due to being on Xarelto.  Continue Lopressor.  Will request most recent labs with PCP, may need to start cholesterol medication if lifestyle modifications do not bring down LDL.  Most recent labs from 2021 on file show mildly elevated LDL. Heart healthy diet and regular cardiovascular exercise encouraged.   PAF Denies any tachycardia or palpitations.  Heart rate well-controlled today.  Continue Lopressor.  Denies any bleeding issues on Xarelto, currently on correct Xarelto dosage.  Continue Xarelto for CHA2DS2-VASc score of 2. Heart healthy diet and regular cardiovascular exercise encouraged.   CHB, s/p PPM Remote device interrogation last month revealed normal device function.  Continue follow-up with  EP.  Hypertension Blood pressure mildly elevated.  Blood pressure well-controlled at home. Discussed to monitor BP at home at least 2 hours after medications and sitting for 5-10 minutes.  Continue Lopressor and losartan. Heart healthy diet and regular cardiovascular exercise encouraged.   OSA She tells me today that she has some issues with the mask not fitting her face appropriately.  Will reach out to sleep services liaison for assistance with this.  Encouraged continued compliance.  Shortness of breath Stable, chronic symptoms, seems to be related to #6-see below.  Echocardiogram from 1 year ago was overall unremarkable. Heart healthy diet and regular cardiovascular exercise encouraged.   Obesity BMI 37.80. Weight loss via diet and exercise encouraged. Discussed the impact being overweight would have on cardiovascular risk.  Disposition: Follow up in 1 year(s) with Dina Rich, MD or APP.  Signed, Sharlene Dory, NP 10/06/2022, 10:05 AM [facility]

## 2022-10-12 ENCOUNTER — Telehealth: Payer: Self-pay | Admitting: *Deleted

## 2022-10-12 DIAGNOSIS — G4733 Obstructive sleep apnea (adult) (pediatric): Secondary | ICD-10-CM

## 2022-10-12 NOTE — Telephone Encounter (Signed)
Contacted to get clarification on needs for CPAP. Says she doesn't have problems with her mask but has trouble keeping it on her face at night. Requesting consultation for implanted sleep apnea device. Referral made for sleep apnea management.

## 2022-10-13 NOTE — Progress Notes (Signed)
Remote pacemaker transmission.   

## 2022-11-06 ENCOUNTER — Ambulatory Visit: Payer: 59 | Admitting: Internal Medicine

## 2022-11-10 ENCOUNTER — Other Ambulatory Visit: Payer: Self-pay | Admitting: Cardiology

## 2022-12-04 ENCOUNTER — Encounter: Payer: Self-pay | Admitting: Cardiovascular Disease

## 2022-12-04 ENCOUNTER — Ambulatory Visit: Payer: Medicare Other | Attending: Cardiovascular Disease | Admitting: Cardiovascular Disease

## 2022-12-04 VITALS — BP 142/72 | HR 62 | Ht 63.0 in | Wt 214.6 lb

## 2022-12-04 DIAGNOSIS — I442 Atrioventricular block, complete: Secondary | ICD-10-CM

## 2022-12-04 LAB — CUP PACEART INCLINIC DEVICE CHECK
Battery Remaining Longevity: 83 mo
Battery Voltage: 2.98 V
Brady Statistic AP VP Percent: 10.55 %
Brady Statistic AP VS Percent: 0.01 %
Brady Statistic AS VP Percent: 89.35 %
Brady Statistic AS VS Percent: 0.08 %
Brady Statistic RA Percent Paced: 10.51 %
Brady Statistic RV Percent Paced: 99.9 %
Date Time Interrogation Session: 20240802174203
Implantable Lead Connection Status: 753985
Implantable Lead Connection Status: 753985
Implantable Lead Connection Status: 753985
Implantable Lead Implant Date: 20201116
Implantable Lead Implant Date: 20201116
Implantable Lead Implant Date: 20201116
Implantable Lead Location: 753858
Implantable Lead Location: 753859
Implantable Lead Location: 753860
Implantable Lead Model: 4798
Implantable Lead Model: 5076
Implantable Lead Model: 5076
Implantable Pulse Generator Implant Date: 20201116
Lead Channel Impedance Value: 342 Ohm
Lead Channel Impedance Value: 361 Ohm
Lead Channel Impedance Value: 399 Ohm
Lead Channel Impedance Value: 475 Ohm
Lead Channel Impedance Value: 475 Ohm
Lead Channel Impedance Value: 494 Ohm
Lead Channel Impedance Value: 513 Ohm
Lead Channel Impedance Value: 551 Ohm
Lead Channel Impedance Value: 551 Ohm
Lead Channel Impedance Value: 608 Ohm
Lead Channel Impedance Value: 703 Ohm
Lead Channel Impedance Value: 703 Ohm
Lead Channel Impedance Value: 741 Ohm
Lead Channel Impedance Value: 741 Ohm
Lead Channel Pacing Threshold Amplitude: 0.5 V
Lead Channel Pacing Threshold Amplitude: 0.5 V
Lead Channel Pacing Threshold Amplitude: 0.625 V
Lead Channel Pacing Threshold Amplitude: 1.75 V
Lead Channel Pacing Threshold Amplitude: 2.125 V
Lead Channel Pacing Threshold Pulse Width: 0.4 ms
Lead Channel Pacing Threshold Pulse Width: 0.4 ms
Lead Channel Pacing Threshold Pulse Width: 0.4 ms
Lead Channel Pacing Threshold Pulse Width: 0.4 ms
Lead Channel Pacing Threshold Pulse Width: 0.4 ms
Lead Channel Sensing Intrinsic Amplitude: 0.75 mV
Lead Channel Sensing Intrinsic Amplitude: 1.75 mV
Lead Channel Sensing Intrinsic Amplitude: 15.125 mV
Lead Channel Sensing Intrinsic Amplitude: 20.125 mV
Lead Channel Setting Pacing Amplitude: 1.5 V
Lead Channel Setting Pacing Amplitude: 2 V
Lead Channel Setting Pacing Amplitude: 2.75 V
Lead Channel Setting Pacing Pulse Width: 0.4 ms
Lead Channel Setting Pacing Pulse Width: 0.4 ms
Lead Channel Setting Sensing Sensitivity: 1.2 mV
Zone Setting Status: 755011
Zone Setting Status: 755011

## 2022-12-04 NOTE — Progress Notes (Signed)
  Electrophysiology Office Note:    Date:  12/04/2022   ID:  Theresa Neal, DOB Mar 25, 1956, MRN [id number]  PCP:  Lianne Moris, PA-C   [facility] Providers Cardiologist:  Dina Rich, MD Cardiology APP:  Sharlene Dory, NP  Electrophysiologist:  Maurice Small, MD     Referring MD: Lianne Moris, PA-C   History of Present Illness:    Theresa Neal is a 67 y.o. female with a medical history significant for complete heart block with Medtronic CRT pacemaker, who presents for electrophysiology follow-up.     She has a Medtronic CRT pacemaker placed in [place] in November 2020 due to complete heart block.     Today, she reports that she is doing very well. she has no device related complaints -- no new tenderness, drainage, redness.   EKGs/Labs/Other Studies Reviewed Today:    Echocardiogram:  TTE October 06, 2021 EF 60 to 65%.  Normal biatrial size   Monitors:   Stress testing:   Advanced imaging:   Cardiac catherization   EKG:         Physical Exam:    VS:  BP (!) 142/72   Pulse 62   Ht 5\' 3"  (1.6 m)   Wt 214 lb 9.6 oz (97.3 kg)   SpO2 96%   BMI 38.01 kg/m     Wt Readings from Last 3 Encounters:  12/04/22 214 lb 9.6 oz (97.3 kg)  10/06/22 213 lb 6.4 oz (96.8 kg)  05/29/22 199 lb (90.3 kg)     GEN:  Well nourished, well developed in no acute distress CARDIAC: RRR, no murmurs, rubs, gallops The device site is normal -- no tenderness, edema, drainage, redness, threatened erosion.  RESPIRATORY:  Normal work of breathing MUSCULOSKELETAL: no edema  Device interrogation - reviewed in detail today.  See Paceart report.  ASSESSMENT & PLAN:    Complete heart block Normal BiV pacemaker function; 100% BiV paced See Paceart report No changes today She is device dependent today  Paroxysmal atrial fibrillation No atrial fibrillation events this interrogation  Secondary hypercoagulable state On Xarelto 15 for chest to VASc score of 2  -- will be 3 later this month January 2024 labs reviewed     Signed, Maurice Small, MD  12/04/2022 9:36 AM    [facility]

## 2022-12-04 NOTE — Patient Instructions (Addendum)
Medication Instructions:  Continue all current medications.  Labwork: none  Testing/Procedures: none  Follow-Up: 1 year   Any Other Special Instructions Will Be Listed Below (If Applicable).  If you need a refill on your cardiac medications before your next appointment, please call your pharmacy.  

## 2022-12-15 ENCOUNTER — Ambulatory Visit (INDEPENDENT_AMBULATORY_CARE_PROVIDER_SITE_OTHER): Payer: 59

## 2022-12-15 ENCOUNTER — Institutional Professional Consult (permissible substitution): Payer: 59 | Admitting: Pulmonary Disease

## 2022-12-15 DIAGNOSIS — I442 Atrioventricular block, complete: Secondary | ICD-10-CM | POA: Diagnosis not present

## 2022-12-21 LAB — CUP PACEART REMOTE DEVICE CHECK
Battery Remaining Longevity: 78 mo
Battery Voltage: 2.98 V
Brady Statistic AP VP Percent: 4.24 %
Brady Statistic AP VS Percent: 0.01 %
Brady Statistic AS VP Percent: 95.67 %
Brady Statistic AS VS Percent: 0.08 %
Brady Statistic RA Percent Paced: 4.23 %
Brady Statistic RV Percent Paced: 99.91 %
Date Time Interrogation Session: 20240817185205
Implantable Lead Connection Status: 753985
Implantable Lead Connection Status: 753985
Implantable Lead Connection Status: 753985
Implantable Lead Implant Date: 20201116
Implantable Lead Implant Date: 20201116
Implantable Lead Implant Date: 20201116
Implantable Lead Location: 753858
Implantable Lead Location: 753859
Implantable Lead Location: 753860
Implantable Lead Model: 4798
Implantable Lead Model: 5076
Implantable Lead Model: 5076
Implantable Pulse Generator Implant Date: 20201116
Lead Channel Impedance Value: 304 Ohm
Lead Channel Impedance Value: 323 Ohm
Lead Channel Impedance Value: 342 Ohm
Lead Channel Impedance Value: 399 Ohm
Lead Channel Impedance Value: 456 Ohm
Lead Channel Impedance Value: 456 Ohm
Lead Channel Impedance Value: 475 Ohm
Lead Channel Impedance Value: 532 Ohm
Lead Channel Impedance Value: 532 Ohm
Lead Channel Impedance Value: 570 Ohm
Lead Channel Impedance Value: 646 Ohm
Lead Channel Impedance Value: 646 Ohm
Lead Channel Impedance Value: 684 Ohm
Lead Channel Impedance Value: 722 Ohm
Lead Channel Pacing Threshold Amplitude: 0.5 V
Lead Channel Pacing Threshold Amplitude: 0.5 V
Lead Channel Pacing Threshold Amplitude: 2.25 V
Lead Channel Pacing Threshold Pulse Width: 0.4 ms
Lead Channel Pacing Threshold Pulse Width: 0.4 ms
Lead Channel Pacing Threshold Pulse Width: 0.4 ms
Lead Channel Sensing Intrinsic Amplitude: 1.875 mV
Lead Channel Sensing Intrinsic Amplitude: 1.875 mV
Lead Channel Sensing Intrinsic Amplitude: 15.125 mV
Lead Channel Sensing Intrinsic Amplitude: 20.125 mV
Lead Channel Setting Pacing Amplitude: 1.5 V
Lead Channel Setting Pacing Amplitude: 2 V
Lead Channel Setting Pacing Amplitude: 3 V
Lead Channel Setting Pacing Pulse Width: 0.4 ms
Lead Channel Setting Pacing Pulse Width: 0.4 ms
Lead Channel Setting Sensing Sensitivity: 1.2 mV
Zone Setting Status: 755011
Zone Setting Status: 755011

## 2022-12-30 NOTE — Progress Notes (Signed)
Remote pacemaker transmission.   

## 2023-01-06 DIAGNOSIS — R030 Elevated blood-pressure reading, without diagnosis of hypertension: Secondary | ICD-10-CM | POA: Diagnosis not present

## 2023-01-06 DIAGNOSIS — N644 Mastodynia: Secondary | ICD-10-CM | POA: Diagnosis not present

## 2023-01-06 DIAGNOSIS — M25512 Pain in left shoulder: Secondary | ICD-10-CM | POA: Diagnosis not present

## 2023-01-27 ENCOUNTER — Other Ambulatory Visit: Payer: Self-pay | Admitting: Family Medicine

## 2023-01-27 DIAGNOSIS — R928 Other abnormal and inconclusive findings on diagnostic imaging of breast: Secondary | ICD-10-CM | POA: Diagnosis not present

## 2023-01-27 DIAGNOSIS — R92323 Mammographic fibroglandular density, bilateral breasts: Secondary | ICD-10-CM | POA: Diagnosis not present

## 2023-01-27 DIAGNOSIS — N644 Mastodynia: Secondary | ICD-10-CM | POA: Diagnosis not present

## 2023-01-27 DIAGNOSIS — R921 Mammographic calcification found on diagnostic imaging of breast: Secondary | ICD-10-CM | POA: Diagnosis not present

## 2023-01-27 DIAGNOSIS — N6459 Other signs and symptoms in breast: Secondary | ICD-10-CM | POA: Diagnosis not present

## 2023-02-03 ENCOUNTER — Ambulatory Visit
Admission: RE | Admit: 2023-02-03 | Discharge: 2023-02-03 | Disposition: A | Discharge status: Home / Self Care | Payer: Medicare Other | Source: Ambulatory Visit | Attending: Family Medicine | Admitting: Family Medicine

## 2023-02-03 DIAGNOSIS — N6012 Diffuse cystic mastopathy of left breast: Secondary | ICD-10-CM | POA: Diagnosis not present

## 2023-02-03 DIAGNOSIS — R928 Other abnormal and inconclusive findings on diagnostic imaging of breast: Secondary | ICD-10-CM

## 2023-02-03 DIAGNOSIS — R921 Mammographic calcification found on diagnostic imaging of breast: Secondary | ICD-10-CM | POA: Diagnosis not present

## 2023-02-03 HISTORY — PX: BREAST BIOPSY: SHX20

## 2023-02-04 LAB — SURGICAL PATHOLOGY

## 2023-02-11 DIAGNOSIS — R42 Dizziness and giddiness: Secondary | ICD-10-CM | POA: Diagnosis not present

## 2023-02-11 DIAGNOSIS — R5383 Other fatigue: Secondary | ICD-10-CM | POA: Diagnosis not present

## 2023-02-11 DIAGNOSIS — R55 Syncope and collapse: Secondary | ICD-10-CM | POA: Diagnosis not present

## 2023-02-11 DIAGNOSIS — R030 Elevated blood-pressure reading, without diagnosis of hypertension: Secondary | ICD-10-CM | POA: Diagnosis not present

## 2023-02-11 DIAGNOSIS — R7989 Other specified abnormal findings of blood chemistry: Secondary | ICD-10-CM | POA: Diagnosis not present

## 2023-02-11 DIAGNOSIS — Z23 Encounter for immunization: Secondary | ICD-10-CM | POA: Diagnosis not present

## 2023-02-11 DIAGNOSIS — R519 Headache, unspecified: Secondary | ICD-10-CM | POA: Diagnosis not present

## 2023-02-16 DIAGNOSIS — L0390 Cellulitis, unspecified: Secondary | ICD-10-CM | POA: Diagnosis not present

## 2023-02-16 DIAGNOSIS — R030 Elevated blood-pressure reading, without diagnosis of hypertension: Secondary | ICD-10-CM | POA: Diagnosis not present

## 2023-02-16 DIAGNOSIS — T8141XA Infection following a procedure, superficial incisional surgical site, initial encounter: Secondary | ICD-10-CM | POA: Diagnosis not present

## 2023-02-21 DIAGNOSIS — R0600 Dyspnea, unspecified: Secondary | ICD-10-CM | POA: Diagnosis not present

## 2023-02-21 DIAGNOSIS — R1013 Epigastric pain: Secondary | ICD-10-CM | POA: Diagnosis not present

## 2023-02-21 DIAGNOSIS — R0602 Shortness of breath: Secondary | ICD-10-CM | POA: Diagnosis not present

## 2023-02-21 DIAGNOSIS — R0609 Other forms of dyspnea: Secondary | ICD-10-CM | POA: Diagnosis not present

## 2023-02-22 ENCOUNTER — Telehealth: Payer: Self-pay | Admitting: Cardiology

## 2023-02-22 DIAGNOSIS — R0602 Shortness of breath: Secondary | ICD-10-CM

## 2023-02-22 DIAGNOSIS — R7989 Other specified abnormal findings of blood chemistry: Secondary | ICD-10-CM | POA: Diagnosis not present

## 2023-02-22 DIAGNOSIS — I10 Essential (primary) hypertension: Secondary | ICD-10-CM | POA: Diagnosis not present

## 2023-02-22 DIAGNOSIS — R5383 Other fatigue: Secondary | ICD-10-CM | POA: Diagnosis not present

## 2023-02-22 DIAGNOSIS — N61 Mastitis without abscess: Secondary | ICD-10-CM | POA: Diagnosis not present

## 2023-02-22 NOTE — Telephone Encounter (Signed)
  Pt said, she was at [facility] and her proBNP is elevated and she was told to call Dr.Branch to get and echocardiogram to evaluate for CHF

## 2023-02-23 NOTE — Telephone Encounter (Signed)
Please order echo for shortness of breath and have her f/u with E Philis Nettle please 3-4 weeks  Dominga Ferry MD

## 2023-02-23 NOTE — Telephone Encounter (Signed)
Patient notified and verbalized understanding.   Appointment scheduled & Echo ordered.

## 2023-03-02 ENCOUNTER — Encounter: Payer: Self-pay | Admitting: Nurse Practitioner

## 2023-03-02 ENCOUNTER — Ambulatory Visit (INDEPENDENT_AMBULATORY_CARE_PROVIDER_SITE_OTHER): Payer: Medicare Other | Admitting: Nurse Practitioner

## 2023-03-02 ENCOUNTER — Telehealth: Payer: Self-pay | Admitting: Nurse Practitioner

## 2023-03-02 VITALS — BP 167/74 | HR 82 | Ht 63.0 in | Wt 224.0 lb

## 2023-03-02 DIAGNOSIS — Z789 Other specified health status: Secondary | ICD-10-CM | POA: Diagnosis not present

## 2023-03-02 DIAGNOSIS — G4733 Obstructive sleep apnea (adult) (pediatric): Secondary | ICD-10-CM

## 2023-03-02 DIAGNOSIS — E669 Obesity, unspecified: Secondary | ICD-10-CM | POA: Insufficient documentation

## 2023-03-02 NOTE — Patient Instructions (Addendum)
Continue to use CPAP as much as possible  Change equipment as directed. Wash your tubing with warm soap and water daily, hang to dry. Wash humidifier portion weekly. Use bottled, distilled water and change daily Be aware of reduced alertness and do not drive or operate heavy machinery if experiencing this or drowsiness.  Exercise encouraged, as tolerated. Healthy weight management discussed.  Avoid or decrease alcohol consumption and medications that make you more sleepy, if possible. Notify if persistent daytime sleepiness occurs even with consistent use of PAP therapy.  We discussed how untreated sleep apnea puts an individual at risk for cardiac arrhthymias, pulm HTN, DM, stroke and increases their risk for daytime accidents. We also briefly reviewed treatment options including weight loss, side sleeping position, oral appliance, CPAP therapy or referral to ENT for possible surgical options  We will request your most recent sleep study and see if we need to repeat or not.  If it's been within the last year and shows moderate to severe sleep apnea, we can move forward with drug induced sleep endoscopy to see if you would be a candidate for Inspire. I will call you to let you know   Make an appointment with your heart doctor for risk assessment prior to drug induced sleep endoscopy and potential Inspire placement  We will schedule your follow up based on the above workup. Call if you need a sooner appointment

## 2023-03-02 NOTE — Assessment & Plan Note (Signed)
BMI 39. Healthy weight loss encouraged.

## 2023-03-02 NOTE — Assessment & Plan Note (Signed)
Moderate OSA. Difficulties tolerating CPAP. Discussed risks of untreated OSA and potential treatment options. Interested in [place]. Last sleep study >1 year ago; needs repeat study to reassess sleep apnea. If she has moderate to severe OSA, will get her set up for drug induced sleep endoscopy with Dr. Vassie Loll or Dr. Wynona Neat to determine candidacy for hypoglossal nerve stimulator procedure with Inspire. She will need cardiology preoperative risk stratification prior to these procedures, which was discussed with the patient today. Will send formal request once sleep study results received. Reviewed safe driving practices.   Patient Instructions  Continue to use CPAP as much as possible  Change equipment as directed. Wash your tubing with warm soap and water daily, hang to dry. Wash humidifier portion weekly. Use bottled, distilled water and change daily Be aware of reduced alertness and do not drive or operate heavy machinery if experiencing this or drowsiness.  Exercise encouraged, as tolerated. Healthy weight management discussed.  Avoid or decrease alcohol consumption and medications that make you more sleepy, if possible. Notify if persistent daytime sleepiness occurs even with consistent use of PAP therapy.  We discussed how untreated sleep apnea puts an individual at risk for cardiac arrhthymias, pulm HTN, DM, stroke and increases their risk for daytime accidents. We also briefly reviewed treatment options including weight loss, side sleeping position, oral appliance, CPAP therapy or referral to ENT for possible surgical options  We will request your most recent sleep study and see if we need to repeat or not.  If it's been within the last year and shows moderate to severe sleep apnea, we can move forward with drug induced sleep endoscopy to see if you would be a candidate for Inspire. I will call you to let you know   Make an appointment with your heart doctor for risk assessment prior to drug  induced sleep endoscopy and potential Inspire placement  We will schedule your follow up based on the above workup. Call if you need a sooner appointment

## 2023-03-02 NOTE — Telephone Encounter (Signed)
Cardiology told patient they need mor information from [place]

## 2023-03-02 NOTE — Progress Notes (Signed)
@Patient  ID: Theresa Neal, female    DOB: 1955/06/19, 67 y.o.   MRN: [id number]  Chief Complaint  Patient presents with   Establish Care    Sleep apnea; CPAP >47yrs [facility]; wants to discuss Inspire    Referring provider: Sharlene Dory, NP  HPI: 67 year old female, former smoker referred for sleep consult. Past medical history significant for complete heart block s/p pacemaker, OSA on CPAP, allergic rhinitis, a fib on Xarelto, HLD, anxiety, RLS.  TEST/EVENTS:  12/02/2021 HST: AHI 16.8/h, SpO2 low 79%  03/02/2023: Today - sleep consult Patient presents today for sleep consult. She has longstanding history of sleep apnea. Initially diagnosed around 10 years ago or so. She was told at this time that it was severe. She was started on CPAP. She then had a repeat sleep study around a year ago that showed moderate OSA. She was advised to continue CPAP. She has trouble tolerating it. Tends to have difficulties with the mask. She's tried multiple kinds.She wants to look at alterative treatment options with Inspire. She already discussed this with her EP specialist, who encouraged her to look into it.  She feels tired all the time. Wakes up a lot at night. Sometimes she's up at midnight and can't go back to sleep. Her late husband would tell her she snored very loudly, which prompted her initial evaluation. She has a headache all the time. She takes melatonin nightly to help her sleep. Denies drowsy driving, sleep parasomnias/paralysis.  She goes to bed around 7-8 pm. Falls asleep quickly usually. Wakes multiple times. Her wake time varies drastically. Last sleep study was in 202 with moderate OSA. She does not use any supplemental oxygen. Weight is stable over the last two years.  She has a history of a fib, complete heart block s/p pacemaker and HTN. No hx of stroke or diabetes.  She is a former smoker. Drinks 2 glasses of wine a week. One cup of coffee in the morning. Lives with her dog. Retired.   Family history of heart disease.  Epworth 16  No Known Allergies  Immunization History  Administered Date(s) Administered   Influenza,inj,Quad PF,6+ Mos 01/09/2021    Past Medical History:  Diagnosis Date   Anxiety    Back pain    Chest pain    Complete heart block (HCC)    Headache    Hypertension    Insomnia    Pacemaker    PAF (paroxysmal atrial fibrillation) (HCC)    SOB (shortness of breath)    Vaginal bleeding     Tobacco History: Social History   Tobacco Use  Smoking Status Former   Types: Cigarettes  Smokeless Tobacco Never   Counseling given: Not Answered   Outpatient Medications Prior to Visit  Medication Sig Dispense Refill   Aspirin-Acetaminophen-Caffeine (GOODY HEADACHE PO) Take 1 each by mouth as needed.     buPROPion ER (WELLBUTRIN SR) 100 MG 12 hr tablet Take 100 mg by mouth. 200mg  every morning & 100mg  every evening     DULoxetine (CYMBALTA) 30 MG capsule Take 30 mg by mouth at bedtime.     JINTELI 1-5 MG-MCG TABS tablet Take 1 tablet by mouth daily.     levocetirizine (XYZAL) 5 MG tablet Take 5 mg by mouth at bedtime.     losartan (COZAAR) 50 MG tablet Take 50 mg by mouth daily.     metoprolol tartrate (LOPRESSOR) 25 MG tablet TAKE 3 TABLETS (75MG ) BY MOUTH TWICE DAILY 180 tablet 6  montelukast (SINGULAIR) 10 MG tablet Take 10 mg by mouth daily.     XARELTO 20 MG TABS tablet Take 20 mg by mouth daily.     No facility-administered medications prior to visit.     Review of Systems:   Constitutional: No weight loss or gain, night sweats, fevers, chills,or lassitude. +fatigue  HEENT: No difficulty swallowing, tooth/dental problems, or sore throat. No sneezing, itching, ear ache, nasal congestion, or post nasal drip. +chronic headaches CV:  No chest pain, orthopnea, PND, swelling in lower extremities, anasarca, dizziness, palpitations, syncope Resp: +snoring. No shortness of breath with exertion or at rest. No excess mucus or change in color of  mucus. No productive or non-productive. No hemoptysis. No wheezing.  No chest wall deformity GI:  No heartburn, indigestion GU: No nocturia   Skin: No rash, lesions, ulcerations MSK:  No joint pain or swelling.   Neuro: No dizziness or lightheadedness.  Psych: No depression or anxiety. Mood stable. +sleep disturbance    Physical Exam:  BP (!) 167/74   Pulse 82   Ht 5\' 3"  (1.6 m)   Wt 224 lb (101.6 kg)   SpO2 99%   BMI 39.68 kg/m   GEN: Pleasant, interactive, well-appearing; obese; in no acute distress HEENT:  Normocephalic and atraumatic. PERRLA. Sclera white. Nasal turbinates pink, moist and patent bilaterally. No rhinorrhea present. Oropharynx pink and moist, without exudate or edema. No lesions, ulcerations, or postnasal drip. Mallampati III NECK:  Supple w/ fair ROM. No JVD present. Normal carotid impulses w/o bruits. Thyroid symmetrical with no goiter or nodules palpated. No lymphadenopathy.   CV: Irregular rhythm, rate controlled, no m/r/g, no peripheral edema. Pulses intact, +2 bilaterally. No cyanosis, pallor or clubbing. PULMONARY:  Unlabored, regular breathing. Clear bilaterally A&P w/o wheezes/rales/rhonchi. No accessory muscle use.  GI: BS present and normoactive. Soft, non-tender to palpation. No organomegaly or masses detected. MSK: No erythema, warmth or tenderness. Cap refil <2 sec all extrem. No deformities or joint swelling noted.  Neuro: A/Ox3. No focal deficits noted.   Skin: Warm, no lesions or rashe Psych: Normal affect and behavior. Judgement and thought content appropriate.     Lab Results:  CBC    Component Value Date/Time   WBC 3.4 (L) 05/29/2022 0848   RBC 3.89 05/29/2022 0848   HGB 13.0 05/29/2022 0848   HCT 37.2 05/29/2022 0848   PLT 141 (L) 05/29/2022 0848   MCV 95.6 05/29/2022 0848   MCH 33.4 05/29/2022 0848   MCHC 34.9 05/29/2022 0848   RDW 13.6 05/29/2022 0848   LYMPHSABS 0.9 05/29/2022 0848   MONOABS 0.4 05/29/2022 0848   EOSABS 0.0  05/29/2022 0848   BASOSABS 0.0 05/29/2022 0848    BMET    Component Value Date/Time   NA 138 05/29/2022 0848   K 3.7 05/29/2022 0848   CL 111 05/29/2022 0848   CO2 18 (L) 05/29/2022 0848   GLUCOSE 125 (H) 05/29/2022 0848   BUN 14 05/29/2022 0848   CREATININE 0.90 05/29/2022 0848   CALCIUM 8.4 (L) 05/29/2022 0848   GFRNONAA >60 05/29/2022 0848    BNP    Component Value Date/Time   BNP 58.0 05/29/2022 0848     Imaging:  MM LT BREAST BX W LOC DEV 1ST LESION IMAGE BX SPEC STEREO GUIDE  Addendum Date: 02/08/2023   ADDENDUM REPORT: 02/08/2023 06:43 ADDENDUM: PATHOLOGY revealed: 1. Breast, left, needle core biopsy, : - BENIGN BREAST TISSUE WITH ADENOSIS AND FIBROCYSTIC CHANGE WITH ASSOCIATED CALCIFICATIONS. Pathology results are CONCORDANT  with imaging findings, per Dr. Frederico Hamman. Pathology results and recommendations were discussed with patient via telephone on 02/05/2023. Patient reported biopsy site doing well with no adverse symptoms, and only slight tenderness at the site. Post biopsy care instructions were reviewed, questions were answered and my direct phone number was provided. Patient was instructed to call [facility] for any additional questions or concerns related to biopsy site. RECOMMENDATION: Patient instructed to continue monthly self breast examinations and resume annual bilateral screening mammogram due September 2025. Pathology results reported by Donell Sievert, RN on 02/05/2023. Electronically Signed   By: Frederico Hamman M.D.   On: 02/08/2023 06:43   Result Date: 02/08/2023 CLINICAL DATA:  67 year old female presenting for stereotactic biopsy of a tubular asymmetry with calcifications in the inferior left breast. EXAM: LEFT BREAST STEREOTACTIC CORE NEEDLE BIOPSY COMPARISON:  Previous exam(s). FINDINGS: The patient and I discussed the procedure of stereotactic-guided biopsy including benefits and alternatives. We discussed the high likelihood  of a successful procedure. We discussed the risks of the procedure including infection, bleeding, tissue injury, clip migration, and inadequate sampling. Informed written consent was given. The usual time out protocol was performed immediately prior to the procedure. Using sterile technique and 1% Lidocaine as local anesthetic, under stereotactic guidance, a 9 gauge vacuum assisted device was used to perform core needle biopsy of a tubular asymmetry with calcifications in the inferior left breast using a medial approach. Specimen radiograph was performed showing faint calcifications in 1-2 core samples. Specimens with calcifications are identified for pathology. Lesion quadrant: Lower inner quadrant At the conclusion of the procedure, a coil shaped tissue marker clip was deployed into the biopsy cavity. Follow-up 2-view mammogram was performed and dictated separately. IMPRESSION: Stereotactic-guided biopsy of a tubular asymmetry with calcifications in the lower-inner left breast (coil clip). No apparent complications. Electronically Signed: By: Frederico Hamman M.D. On: 02/03/2023 11:08   MM CLIP PLACEMENT LEFT  Result Date: 02/03/2023 CLINICAL DATA:  Post biopsy mammogram of the left breast for clip placement. EXAM: 3D DIAGNOSTIC LEFT MAMMOGRAM POST STEREOTACTIC BIOPSY COMPARISON:  Previous exam(s). FINDINGS: 3D Mammographic images were obtained following stereotactic guided biopsy of a tubular asymmetry with calcifications in the lower-inner left breast. The biopsy marking clip is in expected position at the site of biopsy. IMPRESSION: Appropriate positioning of the coil shaped biopsy marking clip at the site of biopsy in the lower-inner left breast. Final Assessment: Post Procedure Mammograms for Marker Placement BI-RADS CATEGORY  46M: Post-Procedure Mammogram for Marker Placement Electronically Signed   By: Frederico Hamman M.D.   On: 02/03/2023 11:14    Administration History     None            No data to display          No results found for: "NITRICOXIDE"      Assessment & Plan:   Moderate obstructive sleep apnea Moderate OSA. Difficulties tolerating CPAP. Discussed risks of untreated OSA and potential treatment options. Interested in [place]. Last sleep study >1 year ago; needs repeat study to reassess sleep apnea. If she has moderate to severe OSA, will get her set up for drug induced sleep endoscopy with Dr. Vassie Loll or Dr. Wynona Neat to determine candidacy for hypoglossal nerve stimulator procedure with Inspire. She will need cardiology preoperative risk stratification prior to these procedures, which was discussed with the patient today. Will send formal request once sleep study results received. Reviewed safe driving practices.   Patient Instructions  Continue to  use CPAP as much as possible  Change equipment as directed. Wash your tubing with warm soap and water daily, hang to dry. Wash humidifier portion weekly. Use bottled, distilled water and change daily Be aware of reduced alertness and do not drive or operate heavy machinery if experiencing this or drowsiness.  Exercise encouraged, as tolerated. Healthy weight management discussed.  Avoid or decrease alcohol consumption and medications that make you more sleepy, if possible. Notify if persistent daytime sleepiness occurs even with consistent use of PAP therapy.  We discussed how untreated sleep apnea puts an individual at risk for cardiac arrhthymias, pulm HTN, DM, stroke and increases their risk for daytime accidents. We also briefly reviewed treatment options including weight loss, side sleeping position, oral appliance, CPAP therapy or referral to ENT for possible surgical options  We will request your most recent sleep study and see if we need to repeat or not.  If it's been within the last year and shows moderate to severe sleep apnea, we can move forward with drug induced sleep endoscopy to see if you would be a  candidate for Inspire. I will call you to let you know   Make an appointment with your heart doctor for risk assessment prior to drug induced sleep endoscopy and potential Inspire placement  We will schedule your follow up based on the above workup. Call if you need a sooner appointment    Obesity (BMI 30-39.9) BMI 39. Healthy weight loss encouraged.   I spent 35 minutes of dedicated to the care of this patient on the date of this encounter to include pre-visit review of records, face-to-face time with the patient discussing conditions above, post visit ordering of testing, clinical documentation with the electronic health record, making appropriate referrals as documented, and communicating necessary findings to members of the patients care team.  Noemi Chapel, NP 03/02/2023  Pt aware and understands NP's role.

## 2023-03-06 NOTE — Telephone Encounter (Signed)
Routing to Florentina Addison so she can reach out to pt's cardiologist.

## 2023-03-10 ENCOUNTER — Ambulatory Visit: Payer: Medicare Other | Attending: Cardiology

## 2023-03-10 DIAGNOSIS — R0602 Shortness of breath: Secondary | ICD-10-CM | POA: Diagnosis not present

## 2023-03-10 LAB — ECHOCARDIOGRAM COMPLETE
AR max vel: 2.64 cm2
AV Area VTI: 2.86 cm2
AV Area mean vel: 2.82 cm2
AV Mean grad: 4 mm[Hg]
AV Peak grad: 8 mm[Hg]
Ao pk vel: 1.41 m/s
Area-P 1/2: 3.74 cm2
Calc EF: 58 %
MV VTI: 3.2 cm2
S' Lateral: 3.2 cm
Single Plane A2C EF: 64 %
Single Plane A4C EF: 53.1 %

## 2023-03-10 NOTE — Telephone Encounter (Signed)
She has to complete a home sleep study before we can consider Theresa Neal because we can't locate her old records. We will request surgical risk assessment, if she's deemed an appropriate candidate for Inspire. Thanks.

## 2023-03-11 DIAGNOSIS — G4730 Sleep apnea, unspecified: Secondary | ICD-10-CM | POA: Diagnosis not present

## 2023-03-11 DIAGNOSIS — G4733 Obstructive sleep apnea (adult) (pediatric): Secondary | ICD-10-CM

## 2023-03-11 DIAGNOSIS — Z789 Other specified health status: Secondary | ICD-10-CM

## 2023-03-11 NOTE — Telephone Encounter (Signed)
ATC x 1 no vmail.

## 2023-03-12 NOTE — Telephone Encounter (Signed)
I called and spoke with the pt and notified of response per Florentina Addison  She verbalized understanding  Nothing further needed

## 2023-03-16 ENCOUNTER — Ambulatory Visit (INDEPENDENT_AMBULATORY_CARE_PROVIDER_SITE_OTHER): Payer: Medicare Other

## 2023-03-16 DIAGNOSIS — I442 Atrioventricular block, complete: Secondary | ICD-10-CM

## 2023-03-20 LAB — CUP PACEART REMOTE DEVICE CHECK
Battery Remaining Longevity: 77 mo
Battery Voltage: 2.97 V
Brady Statistic AP VP Percent: 15.39 %
Brady Statistic AP VS Percent: 0.01 %
Brady Statistic AS VP Percent: 84.35 %
Brady Statistic AS VS Percent: 0.26 %
Brady Statistic RA Percent Paced: 15.44 %
Brady Statistic RV Percent Paced: 99.73 %
Date Time Interrogation Session: 20241116040721
Implantable Lead Connection Status: 753985
Implantable Lead Connection Status: 753985
Implantable Lead Connection Status: 753985
Implantable Lead Implant Date: 20201116
Implantable Lead Implant Date: 20201116
Implantable Lead Implant Date: 20201116
Implantable Lead Location: 753858
Implantable Lead Location: 753859
Implantable Lead Location: 753860
Implantable Lead Model: 4798
Implantable Lead Model: 5076
Implantable Lead Model: 5076
Implantable Pulse Generator Implant Date: 20201116
Lead Channel Impedance Value: 342 Ohm
Lead Channel Impedance Value: 418 Ohm
Lead Channel Impedance Value: 456 Ohm
Lead Channel Impedance Value: 456 Ohm
Lead Channel Impedance Value: 494 Ohm
Lead Channel Impedance Value: 494 Ohm
Lead Channel Impedance Value: 494 Ohm
Lead Channel Impedance Value: 532 Ohm
Lead Channel Impedance Value: 570 Ohm
Lead Channel Impedance Value: 703 Ohm
Lead Channel Impedance Value: 779 Ohm
Lead Channel Impedance Value: 779 Ohm
Lead Channel Impedance Value: 798 Ohm
Lead Channel Impedance Value: 798 Ohm
Lead Channel Pacing Threshold Amplitude: 0.5 V
Lead Channel Pacing Threshold Amplitude: 0.5 V
Lead Channel Pacing Threshold Amplitude: 2.375 V
Lead Channel Pacing Threshold Pulse Width: 0.4 ms
Lead Channel Pacing Threshold Pulse Width: 0.4 ms
Lead Channel Pacing Threshold Pulse Width: 0.4 ms
Lead Channel Sensing Intrinsic Amplitude: 0.875 mV
Lead Channel Sensing Intrinsic Amplitude: 0.875 mV
Lead Channel Sensing Intrinsic Amplitude: 15.125 mV
Lead Channel Sensing Intrinsic Amplitude: 20.125 mV
Lead Channel Setting Pacing Amplitude: 1.5 V
Lead Channel Setting Pacing Amplitude: 2 V
Lead Channel Setting Pacing Amplitude: 3 V
Lead Channel Setting Pacing Pulse Width: 0.4 ms
Lead Channel Setting Pacing Pulse Width: 0.4 ms
Lead Channel Setting Sensing Sensitivity: 1.2 mV
Zone Setting Status: 755011
Zone Setting Status: 755011

## 2023-03-22 ENCOUNTER — Ambulatory Visit: Payer: Medicare Other | Attending: Nurse Practitioner | Admitting: Nurse Practitioner

## 2023-03-22 ENCOUNTER — Telehealth: Payer: Self-pay | Admitting: Nurse Practitioner

## 2023-03-22 ENCOUNTER — Encounter: Payer: Self-pay | Admitting: Nurse Practitioner

## 2023-03-22 VITALS — BP 122/70 | HR 80 | Ht 63.0 in | Wt 222.0 lb

## 2023-03-22 DIAGNOSIS — G4733 Obstructive sleep apnea (adult) (pediatric): Secondary | ICD-10-CM | POA: Diagnosis not present

## 2023-03-22 DIAGNOSIS — R0609 Other forms of dyspnea: Secondary | ICD-10-CM | POA: Diagnosis not present

## 2023-03-22 DIAGNOSIS — I10 Essential (primary) hypertension: Secondary | ICD-10-CM | POA: Diagnosis not present

## 2023-03-22 DIAGNOSIS — R0789 Other chest pain: Secondary | ICD-10-CM

## 2023-03-22 DIAGNOSIS — I480 Paroxysmal atrial fibrillation: Secondary | ICD-10-CM | POA: Diagnosis not present

## 2023-03-22 DIAGNOSIS — I25119 Atherosclerotic heart disease of native coronary artery with unspecified angina pectoris: Secondary | ICD-10-CM | POA: Diagnosis not present

## 2023-03-22 DIAGNOSIS — E669 Obesity, unspecified: Secondary | ICD-10-CM

## 2023-03-22 DIAGNOSIS — R0602 Shortness of breath: Secondary | ICD-10-CM

## 2023-03-22 DIAGNOSIS — Z950 Presence of cardiac pacemaker: Secondary | ICD-10-CM

## 2023-03-22 MED ORDER — NITROGLYCERIN 0.4 MG SL SUBL: 0.4000 mg | SUBLINGUAL_TABLET | SUBLINGUAL | 3 refills | Status: AC | PRN

## 2023-03-22 NOTE — Progress Notes (Unsigned)
Office Visit    Patient Name: Theresa Neal Date of Encounter: 03/22/2023 PCP:  [facility]  Cardiologist:  Dina Rich, MD  Advanced Practice Provider:  Sharlene Dory, NP Electrophysiologist:  Maurice Small, MD [id number]}  Chief Complaint and HPI    Theresa Neal is a 67 y.o. female with a CAD, hx of palpitations, CHB, s/p BiV PPM, hypertension, A-fib, OSA, intermittent shortness of  breath, who presents today for 3-4 week follow-up.    Previously followed by cardiology team at [facility] in [place], [place].  Last seen by Dr. Dina Rich on Sep 26, 2021.  At that time, she noted palpitations occasionally, Lopressor was increased to 75 mg twice daily.  Did note shortness of breath, unclear etiology.  Echocardiogram was arranged-benign.  CCTA was arranged-coronary artery calcium score 29, noncalcified plaque in mid LAD with moderate stenosis (50 to 69%), CT FFR 0.84 across lesion-not hemodynamically significant.  See full report below.  Visit at [facility], ED in January 2024 for palpitations and acute onset of shortness of breath.  EKG showed sinus rhythm, right bundle branch block, old ST abnormality.  Given she was having no pathological arrhythmias and was hemodynamically stable, there were no acute findings or anything to treat acutely, was discharged in stable condition.  Last seen for scheduled follow-up on October 06, 2022. Continued to admit to stable, chronic shortness of breath with extreme activities, attributed this to her weight. Denied any chest pain, palpitations, syncope, presyncope, dizziness, orthopnea, PND, swelling or significant weight changes, acute bleeding, or claudication.  Reported drinking alcohol socially on occasion, denied any tobacco or illicit drug use.  ED visit on February 21, 2023 at [facility].  Patient reported having some epigastric discomfort and some shortness of breath, denied any chest  pain, nausea/vomiting or sweating.  Was noted that she had a breast biopsy performed, noted infection of left breast tissue.  Patient was taking antibiotics at the time for this.  Workup was overall unremarkable.  proBNP level was 248.  She was advised to follow-up with cardiology.  Echocardiogram performed recently that revealed normal EF, grade 1 DD, mild MR, no other significant valvular abnormalities.  Today she presents for follow-up.  She notes intermittent chest pain along the left breast from where her biopsy was taken, denies any exertional component.  She admits to shortness of breath on exertion that she attributes to her weight, she has also been dealing with insomnia.  She is looking to have inspire device placed for her OSA.  Says her DOE is stable over time. Denies any palpitations, syncope, presyncope, dizziness, orthopnea, PND, swelling or significant weight changes, acute bleeding, or claudication.  FH: She says several siblings are on cholesterol medication, no other significant family history of CVD.  EKGs/Labs/Other Studies Reviewed:   The following studies were reviewed today:   EKG:  EKG is not ordered today.   Echocardiogram 03/2023:  1. Left ventricular ejection fraction, by estimation, is 60 to 65%. The  left ventricle has normal function. The left ventricle has no regional  wall motion abnormalities. Left ventricular diastolic parameters are  consistent with Grade I diastolic  dysfunction (impaired relaxation).   2. Right ventricular systolic function is normal. The right ventricular  size is normal. Tricuspid regurgitation signal is inadequate for assessing  PA pressure.   3. The mitral valve is grossly normal. Mild mitral valve regurgitation.   4. The aortic valve is tricuspid. Aortic valve regurgitation is not  visualized. No aortic stenosis is present. Aortic valve mean gradient  measures 4.0 mmHg.   5. The inferior vena cava is normal in size with greater  than 50%  respiratory variability, suggesting right atrial pressure of 3 mmHg.   Comparison(s): Prior images reviewed side by side. LVEF remains normal range at 60-65%.  CCTA 12/2021: IMPRESSION: 1. Coronary calcium score of 29. This was 72nd percentile for age and sex matched control.   2. Normal coronary origin with left dominance.   3. Noncalcified plaque in mid LAD causes moderate (50-69%) stenosis.   4. Distal LCX poorly visualized due to artifact from BiV pacemaker lead   5. Will send study for CTFFR   CAD-RADS 3. Moderate stenosis. Consider symptom-guided anti-ischemic pharmacotherapy as well as risk factor modification per guideline directed care. Additional analysis with CT FFR will be submitted. IMPRESSION: 1. CTFFR 0.84 across lesion in mid LAD, suggesting lesion is not hemodynamically significant   2. LCX not modeled due to artifact from device  Echocardiogram 10/2021: 1. Left ventricular ejection fraction, by estimation, is 60 to 65%. The  left ventricle has normal function. The left ventricle has no regional  wall motion abnormalities. Left ventricular diastolic parameters are  consistent with Grade I diastolic  dysfunction (impaired relaxation). The average left ventricular global  longitudinal strain is -21.4 %. The global longitudinal strain is normal.   2. Right ventricular systolic function is normal. The right ventricular  size is normal.   3. The mitral valve is normal in structure. Trivial mitral valve  regurgitation. No evidence of mitral stenosis.   4. The aortic valve is tricuspid. Aortic valve regurgitation is not  visualized. No aortic stenosis is present.   5. The inferior vena cava is normal in size with greater than 50%  respiratory variability, suggesting right atrial pressure of 3 mmHg.   Comparison(s): Echocardiogram done 11/30/19 showed an EF of 55-60%.  Risk Assessment/Calculations:   CHA2DS2-VASc Score = 2  This indicates a 2.2% annual  risk of stroke. The patient's score is based upon: CHF History: 0 HTN History: 1 Diabetes History: 0 Stroke History: 0 Vascular Disease History: 0 Age Score: 0 Gender Score: 1   Review of Systems    All other systems reviewed and are otherwise negative except as noted above.  Physical Exam    VS:  BP 122/70   Pulse 80   Ht 5\' 3"  (1.6 m)   Wt 222 lb (100.7 kg)   SpO2 98%   BMI 39.33 kg/m  , BMI Body mass index is 39.33 kg/m.  Wt Readings from Last 3 Encounters:  03/22/23 222 lb (100.7 kg)  03/02/23 224 lb (101.6 kg)  12/04/22 214 lb 9.6 oz (97.3 kg)     GEN: Obese, 67 year old female in no acute distress. HEENT: normal. Neck: Supple, no JVD, carotid bruits, or masses. Cardiac: S1/S2, RRR, no murmurs, rubs, or gallops. No clubbing, cyanosis, edema.  Radials/PT 2+ and equal bilaterally.  Respiratory:  Respirations regular and unlabored, clear to auscultation bilaterally. GI: Soft, nontender, nondistended. MS: No deformity or atrophy. Skin: Warm and dry, no rash. Neuro:  Strength and sensation are intact. Psych: Normal affect.  Assessment & Plan    CAD, atypical chest pain CCTA 1 year ago revealed moderate stenosis along mid LAD, not hemodynamically significant lesion seen with FFR.  Admits to atypical chest pain due to her hx of breast biopsy/infection, but admits to dyspnea on exertion, stable symptoms over time.  Echocardiogram recently obtained was benign.  Patient requests to hold off on further cardiac testing at this time.  Not on aspirin due to being on Xarelto.  Will prescribe nitroglycerin as needed for chest pain.  Educated her about this and she verbalized understanding.  Continue rest of medication regimen. Heart healthy diet and regular cardiovascular exercise encouraged. Care and ED precautions discussed.  PAF Denies any tachycardia or palpitations.  Heart rate well-controlled today.  Continue Lopressor.  Denies any bleeding issues on Xarelto, currently on  correct Xarelto dosage.  Continue Xarelto for CHA2DS2-VASc score of 2. Heart healthy diet and regular cardiovascular exercise encouraged.   CHB, s/p PPM Recent remote device check revealed normal device function.  Continue follow-up with EP.  Hypertension Blood pressure stable. Discussed to monitor BP at home at least 2 hours after medications and sitting for 5-10 minutes.  Continue Lopressor and losartan. Heart healthy diet and regular cardiovascular exercise encouraged.   OSA She is intolerant to CPAP, is looking to have inspire device placed for her OSA.  Continue follow-up with pulmonology.  DOE Stable, chronic symptoms, seems to multifactorial in etiology.  Recent echo was benign.  proBNP mildly elevated at 248, however this could be elevated due to other factors, such as obesity for example.  She does not show any acute signs of CHF on exam.  Weight is stable. Will arrange PFT's for further evaluation.  She politely declines further cardiac testing at this time.  Heart healthy diet and regular cardiovascular exercise encouraged.   Obesity Weight loss via diet and exercise encouraged. Discussed the impact being overweight would have on cardiovascular risk.  Disposition: Follow up in 2-3 months with Dina Rich, MD or APP.  Signed, Sharlene Dory, NP

## 2023-03-22 NOTE — Patient Instructions (Addendum)
Medication Instructions:  Your physician has recommended you make the following change in your medication:  The proper use and anticipated side effects of nitroglycerine has been carefully explained.  If a single episode of chest pain is not relieved by one tablet, the patient will try another within 5 minutes; and if this doesn't relieve the pain, the patient is instructed to call 911 for transportation to an emergency department.  Labwork: None   Testing/Procedures: Your physician has recommended that you have a pulmonary function test. Pulmonary Function Tests are a group of tests that measure how well air moves in and out of your lungs.  Follow-Up: Your physician recommends that you schedule a follow-up appointment in: 2-3 Months   Any Other Special Instructions Will Be Listed Below (If Applicable).  If you need a refill on your cardiac medications before your next appointment, please call your pharmacy.

## 2023-03-22 NOTE — Telephone Encounter (Signed)
Checking percert on the following patient for testing   PFT'S 04/06/2023 @ [facility]

## 2023-04-06 ENCOUNTER — Ambulatory Visit (HOSPITAL_COMMUNITY)
Admission: RE | Admit: 2023-04-06 | Discharge: 2023-04-06 | Disposition: A | Discharge status: Home / Self Care | Payer: Medicare Other | Source: Ambulatory Visit | Attending: Nurse Practitioner | Admitting: Nurse Practitioner

## 2023-04-06 DIAGNOSIS — R0609 Other forms of dyspnea: Secondary | ICD-10-CM | POA: Diagnosis not present

## 2023-04-06 LAB — PULMONARY FUNCTION TEST
DL/VA % pred: 95 %
DL/VA: 4.02 ml/min/mmHg/L
DLCO unc % pred: 74 %
DLCO unc: 14.29 ml/min/mmHg
FEF 25-75 Post: 1.84 L/s
FEF 25-75 Pre: 1.5 L/s
FEF2575-%Change-Post: 22 %
FEF2575-%Pred-Post: 88 %
FEF2575-%Pred-Pre: 72 %
FEV1-%Change-Post: 1 %
FEV1-%Pred-Post: 73 %
FEV1-%Pred-Pre: 71 %
FEV1-Post: 1.7 L
FEV1-Pre: 1.67 L
FEV1FVC-%Change-Post: 5 %
FEV1FVC-%Pred-Pre: 101 %
FEV6-%Change-Post: -2 %
FEV6-%Pred-Post: 70 %
FEV6-%Pred-Pre: 71 %
FEV6-Post: 2.04 L
FEV6-Pre: 2.09 L
FEV6FVC-%Change-Post: 1 %
FEV6FVC-%Pred-Post: 104 %
FEV6FVC-%Pred-Pre: 102 %
FVC-%Change-Post: -2 %
FVC-%Pred-Post: 67 %
FVC-%Pred-Pre: 69 %
FVC-Post: 2.06 L
FVC-Pre: 2.13 L
Post FEV1/FVC ratio: 82 %
Post FEV6/FVC ratio: 100 %
Pre FEV1/FVC ratio: 78 %
Pre FEV6/FVC Ratio: 99 %
RV % pred: 100 %
RV: 2.04 L
TLC % pred: 87 %
TLC: 4.29 L

## 2023-04-06 MED ORDER — ALBUTEROL SULFATE (2.5 MG/3ML) 0.083% IN NEBU
2.5000 mg | INHALATION_SOLUTION | Freq: Once | RESPIRATORY_TRACT | Status: AC
  Administered 2023-04-06: 2.5 mg via RESPIRATORY_TRACT

## 2023-04-13 NOTE — Progress Notes (Signed)
Remote pacemaker transmission.   

## 2023-05-10 ENCOUNTER — Other Ambulatory Visit: Payer: Self-pay | Admitting: Nurse Practitioner

## 2023-05-10 DIAGNOSIS — R0602 Shortness of breath: Secondary | ICD-10-CM

## 2023-05-10 DIAGNOSIS — J40 Bronchitis, not specified as acute or chronic: Secondary | ICD-10-CM

## 2023-05-10 DIAGNOSIS — R0609 Other forms of dyspnea: Secondary | ICD-10-CM

## 2023-05-24 ENCOUNTER — Encounter: Payer: Self-pay | Admitting: Nurse Practitioner

## 2023-05-24 ENCOUNTER — Ambulatory Visit: Payer: Medicare Other | Attending: Nurse Practitioner | Admitting: Nurse Practitioner

## 2023-05-24 VITALS — BP 122/72 | HR 80 | Ht 63.0 in | Wt 234.0 lb

## 2023-05-24 DIAGNOSIS — I480 Paroxysmal atrial fibrillation: Secondary | ICD-10-CM

## 2023-05-24 DIAGNOSIS — G4733 Obstructive sleep apnea (adult) (pediatric): Secondary | ICD-10-CM

## 2023-05-24 DIAGNOSIS — R942 Abnormal results of pulmonary function studies: Secondary | ICD-10-CM | POA: Diagnosis not present

## 2023-05-24 DIAGNOSIS — R0609 Other forms of dyspnea: Secondary | ICD-10-CM | POA: Diagnosis not present

## 2023-05-24 DIAGNOSIS — I38 Endocarditis, valve unspecified: Secondary | ICD-10-CM

## 2023-05-24 DIAGNOSIS — I2510 Atherosclerotic heart disease of native coronary artery without angina pectoris: Secondary | ICD-10-CM | POA: Diagnosis not present

## 2023-05-24 DIAGNOSIS — Z950 Presence of cardiac pacemaker: Secondary | ICD-10-CM | POA: Diagnosis not present

## 2023-05-24 DIAGNOSIS — I10 Essential (primary) hypertension: Secondary | ICD-10-CM | POA: Diagnosis not present

## 2023-05-24 NOTE — Patient Instructions (Addendum)

## 2023-05-24 NOTE — Progress Notes (Signed)
Office Visit    Patient Name: Theresa Neal Date of Encounter: 05/24/2023 PCP:  [facility]  Cardiologist:  Dina Rich, MD  Advanced Practice Provider:  Sharlene Dory, NP Electrophysiologist:  Maurice Small, MD 0746}  Chief Complaint and HPI    Theresa Neal is a 68 y.o. female with a CAD, hx of palpitations, CHB, s/p BiV PPM, hypertension, A-fib, OSA, intermittent shortness of  breath, who presents today for scheduled follow-up.  Previously followed by cardiology team at [facility] in [place], [place].  Last seen by Dr. Dina Rich on Sep 26, 2021.  At that time, she noted palpitations occasionally, Lopressor was increased to 75 mg twice daily.  Did note shortness of breath, unclear etiology.  Echocardiogram was arranged-benign.  CCTA was arranged-coronary artery calcium score 29, noncalcified plaque in mid LAD with moderate stenosis (50 to 69%), CT FFR 0.84 across lesion-not hemodynamically significant.  See full report below.  Visit at [facility], ED in January 2024 for palpitations and acute onset of shortness of breath.  EKG showed sinus rhythm, right bundle branch block, old ST abnormality.  Given she was having no pathological arrhythmias and was hemodynamically stable, there were no acute findings or anything to treat acutely, was discharged in stable condition.  Last seen for scheduled follow-up on October 06, 2022. Continued to admit to stable, chronic shortness of breath with extreme activities, attributed this to her weight. Denied any chest pain, palpitations, syncope, presyncope, dizziness, orthopnea, PND, swelling or significant weight changes, acute bleeding, or claudication.  Reported drinking alcohol socially on occasion, denied any tobacco or illicit drug use.  ED visit on February 21, 2023 at [facility].  Patient reported having some epigastric discomfort and some shortness of breath, denied any chest pain,  nausea/vomiting or sweating.  Was noted that she had a breast biopsy performed, noted infection of left breast tissue.  Patient was taking antibiotics at the time for this.  Workup was overall unremarkable.  proBNP level was 248.  She was advised to follow-up with cardiology.  Echocardiogram performed recently that revealed normal EF, grade 1 DD, mild MR, no other significant valvular abnormalities.  03/22/2023-today she presents for follow-up.  She notes intermittent chest pain along the left breast from where her biopsy was taken, denies any exertional component.  She admits to shortness of breath on exertion that she attributes to her weight, she has also been dealing with insomnia.  She is looking to have inspire device placed for her OSA.  Says her DOE is stable over time. Denies any palpitations, syncope, presyncope, dizziness, orthopnea, PND, swelling or significant weight changes, acute bleeding, or claudication.  PFTs were arranged and were abnormal-was referred to pulmonology for further evaluation.  05/24/2023-presents today for follow-up denies any recent changes to her health.  Says she has not heard back regarding pulmonology referral.  Says she did complete sleep study but dealt with insomnia during study so she is unsure how accurate the results will be.  Says she is taking 50 mg of trazodone nightly and says does not seem to be helping her insomnia. Denies any chest pain, worsening shortness of breath, palpitations, syncope, presyncope, dizziness, orthopnea, PND, swelling or significant weight changes, acute bleeding, or claudication.  Admits to chronic, stable shortness of breath with exertion.  FH: She says several siblings are on cholesterol medication, no other significant family history of CVD.  EKGs/Labs/Other Studies Reviewed:   The following studies were reviewed today:  EKG:   EKG Interpretation Date/Time:  Monday May 24 2023 09:38:34 EST Ventricular Rate:  82 PR  Interval:  150 QRS Duration:  112 QT Interval:  434 QTC Calculation: 507 R Axis:   255  Text Interpretation: Atrial-sensed ventricular-paced rhythm When compared with ECG of 29-May-2022 08:24, PREVIOUS ECG IS PRESENT Confirmed by Sharlene Dory [phone]) on 05/24/2023 9:39:32 AM    Echocardiogram 03/2023:  1. Left ventricular ejection fraction, by estimation, is 60 to 65%. The  left ventricle has normal function. The left ventricle has no regional  wall motion abnormalities. Left ventricular diastolic parameters are  consistent with Grade I diastolic  dysfunction (impaired relaxation).   2. Right ventricular systolic function is normal. The right ventricular  size is normal. Tricuspid regurgitation signal is inadequate for assessing  PA pressure.   3. The mitral valve is grossly normal. Mild mitral valve regurgitation.   4. The aortic valve is tricuspid. Aortic valve regurgitation is not  visualized. No aortic stenosis is present. Aortic valve mean gradient  measures 4.0 mmHg.   5. The inferior vena cava is normal in size with greater than 50%  respiratory variability, suggesting right atrial pressure of 3 mmHg.   Comparison(s): Prior images reviewed side by side. LVEF remains normal range at 60-65%.  CCTA 12/2021: IMPRESSION: 1. Coronary calcium score of 29. This was 72nd percentile for age and sex matched control.   2. Normal coronary origin with left dominance.   3. Noncalcified plaque in mid LAD causes moderate (50-69%) stenosis.   4. Distal LCX poorly visualized due to artifact from BiV pacemaker lead   5. Will send study for CTFFR   CAD-RADS 3. Moderate stenosis. Consider symptom-guided anti-ischemic pharmacotherapy as well as risk factor modification per guideline directed care. Additional analysis with CT FFR will be submitted. IMPRESSION: 1. CTFFR 0.84 across lesion in mid LAD, suggesting lesion is not hemodynamically significant   2. LCX not modeled due to  artifact from device  Echocardiogram 10/2021: 1. Left ventricular ejection fraction, by estimation, is 60 to 65%. The  left ventricle has normal function. The left ventricle has no regional  wall motion abnormalities. Left ventricular diastolic parameters are  consistent with Grade I diastolic  dysfunction (impaired relaxation). The average left ventricular global  longitudinal strain is -21.4 %. The global longitudinal strain is normal.   2. Right ventricular systolic function is normal. The right ventricular  size is normal.   3. The mitral valve is normal in structure. Trivial mitral valve  regurgitation. No evidence of mitral stenosis.   4. The aortic valve is tricuspid. Aortic valve regurgitation is not  visualized. No aortic stenosis is present.   5. The inferior vena cava is normal in size with greater than 50%  respiratory variability, suggesting right atrial pressure of 3 mmHg.   Comparison(s): Echocardiogram done 11/30/19 showed an EF of 55-60%.  Risk Assessment/Calculations:   CHA2DS2-VASc Score = 2  This indicates a 2.2% annual risk of stroke. The patient's score is based upon: CHF History: 0 HTN History: 1 Diabetes History: 0 Stroke History: 0 Vascular Disease History: 0 Age Score: 0 Gender Score: 1   Review of Systems    All other systems reviewed and are otherwise negative except as noted above.  Physical Exam    VS:  BP 122/72   Pulse 80   Ht 5\' 3"  (1.6 m)   Wt 234 lb (106.1 kg)   SpO2 100%   BMI 41.45 kg/m  , BMI Body  mass index is 41.45 kg/m.  Wt Readings from Last 3 Encounters:  05/24/23 234 lb (106.1 kg)  03/22/23 222 lb (100.7 kg)  03/02/23 224 lb (101.6 kg)     GEN: Morbidly obese, 68 year old female in no acute distress. HEENT: normal. Neck: Supple, no JVD, carotid bruits, or masses. Cardiac: S1/S2, RRR, Grade 2/6 murmur, no rubs, no gallops. No clubbing, cyanosis, edema.  Radials/PT 2+ and equal bilaterally.  Respiratory:  Respirations  regular and unlabored, clear and diminished to auscultation bilaterally. GI: Soft, nontender, nondistended. MS: No deformity or atrophy. Skin: Warm and dry, no rash. Neuro:  Strength and sensation are intact. Psych: Normal affect.  Assessment & Plan    CAD CCTA 1 year ago revealed moderate stenosis along mid LAD, not hemodynamically significant lesion seen with FFR. Stable with no anginal symptoms. No indication for ischemic evaluation.  Most recent echocardiogram obtained was benign.  Not on aspirin due to being on Xarelto.  Continue current medication regimen. Heart healthy diet and regular cardiovascular exercise encouraged. Care and ED precautions discussed.  PAF Denies any tachycardia or palpitations.  Heart rate well-controlled today.  Continue Lopressor.  Denies any bleeding issues on Xarelto, currently on correct Xarelto dosage.  Continue Xarelto for CHA2DS2-VASc score of 2. Heart healthy diet and regular cardiovascular exercise encouraged.   CHB, s/p PPM Recent remote device check revealed normal device function.  Continue follow-up with EP.  Hypertension Blood pressure stable. Discussed to monitor BP at home at least 2 hours after medications and sitting for 5-10 minutes.  Continue Lopressor and losartan. Heart healthy diet and regular cardiovascular exercise encouraged.   OSA She is intolerant to CPAP, is looking to have inspire device placed for her OSA.  Will provide referral again for pulmonology evaluation.  DOE, abnormal PFT Stable, chronic symptoms, seems to multifactorial in etiology.  Recent echo was benign.  proBNP mildly elevated at 248, however this could be elevated due to other factors, such as obesity for example.  She does not show any acute signs of CHF on exam.  Most recent PFTs were abnormal-will provide another referral to pulmonology for evaluation.  Heart healthy diet and regular cardiovascular exercise encouraged. Care and ED precautions discussed.  7.   Valvular insufficiency Mild mitral valve regurgitation and mild TR noted on echocardiogram 03/2023.  Denies any specific/alarming symptoms.  Plan to update echocardiogram in 2-3 years for further evaluation.   8. Morbid obesity Weight loss via diet and exercise encouraged. Discussed the impact being overweight would have on cardiovascular risk.  Disposition: Care and ED precautions discussed. Follow up in 6 months with Dina Rich, MD or APP.  Signed, Sharlene Dory, NP

## 2023-05-25 ENCOUNTER — Encounter (HOSPITAL_COMMUNITY): Payer: Medicare Other

## 2023-06-15 ENCOUNTER — Ambulatory Visit (INDEPENDENT_AMBULATORY_CARE_PROVIDER_SITE_OTHER): Payer: Medicaid Other

## 2023-06-15 DIAGNOSIS — I442 Atrioventricular block, complete: Secondary | ICD-10-CM | POA: Diagnosis not present

## 2023-06-18 LAB — CUP PACEART REMOTE DEVICE CHECK
Battery Remaining Longevity: 74 mo
Battery Voltage: 2.97 V
Brady Statistic AP VP Percent: 2.77 %
Brady Statistic AP VS Percent: 0.01 %
Brady Statistic AS VP Percent: 96.49 %
Brady Statistic AS VS Percent: 0.73 %
Brady Statistic RA Percent Paced: 3.22 %
Brady Statistic RV Percent Paced: 99.26 %
Date Time Interrogation Session: 20250212183638
Implantable Lead Connection Status: 753985
Implantable Lead Connection Status: 753985
Implantable Lead Connection Status: 753985
Implantable Lead Implant Date: 20201116
Implantable Lead Implant Date: 20201116
Implantable Lead Implant Date: 20201116
Implantable Lead Location: 753858
Implantable Lead Location: 753859
Implantable Lead Location: 753860
Implantable Lead Model: 4798
Implantable Lead Model: 5076
Implantable Lead Model: 5076
Implantable Pulse Generator Implant Date: 20201116
Lead Channel Impedance Value: 323 Ohm
Lead Channel Impedance Value: 399 Ohm
Lead Channel Impedance Value: 437 Ohm
Lead Channel Impedance Value: 475 Ohm
Lead Channel Impedance Value: 475 Ohm
Lead Channel Impedance Value: 494 Ohm
Lead Channel Impedance Value: 551 Ohm
Lead Channel Impedance Value: 589 Ohm
Lead Channel Impedance Value: 627 Ohm
Lead Channel Impedance Value: 722 Ohm
Lead Channel Impedance Value: 760 Ohm
Lead Channel Impedance Value: 760 Ohm
Lead Channel Impedance Value: 779 Ohm
Lead Channel Impedance Value: 779 Ohm
Lead Channel Pacing Threshold Amplitude: 0.5 V
Lead Channel Pacing Threshold Amplitude: 0.625 V
Lead Channel Pacing Threshold Amplitude: 2.125 V
Lead Channel Pacing Threshold Pulse Width: 0.4 ms
Lead Channel Pacing Threshold Pulse Width: 0.4 ms
Lead Channel Pacing Threshold Pulse Width: 0.4 ms
Lead Channel Sensing Intrinsic Amplitude: 0.75 mV
Lead Channel Sensing Intrinsic Amplitude: 0.75 mV
Lead Channel Sensing Intrinsic Amplitude: 15.125 mV
Lead Channel Sensing Intrinsic Amplitude: 20.125 mV
Lead Channel Setting Pacing Amplitude: 1.5 V
Lead Channel Setting Pacing Amplitude: 2 V
Lead Channel Setting Pacing Amplitude: 3 V
Lead Channel Setting Pacing Pulse Width: 0.4 ms
Lead Channel Setting Pacing Pulse Width: 0.4 ms
Lead Channel Setting Sensing Sensitivity: 1.2 mV
Zone Setting Status: 755011
Zone Setting Status: 755011

## 2023-06-22 ENCOUNTER — Other Ambulatory Visit: Payer: Self-pay | Admitting: Cardiology

## 2023-06-29 ENCOUNTER — Encounter: Payer: Self-pay | Admitting: Cardiovascular Disease

## 2023-06-30 ENCOUNTER — Institutional Professional Consult (permissible substitution): Payer: Medicare Other | Admitting: Internal Medicine

## 2023-07-03 NOTE — Progress Notes (Deleted)
 Theresa Neal, female    DOB: 19-Jan-1956    MRN: [id number]   Brief patient profile:  89  yowf  *** referred to pulmonary clinic in [place]  07/05/2023 by *** for doe with pfts 04/06/23 with ERV 24%/ wt 220 nl otherwise       History of Present Illness  07/05/2023  Pulmonary/ 1st office eval/ Sherene Sires / [place] [facility]  No chief complaint on file.    Dyspnea:  *** Cough: *** Sleep: *** SABA use: *** 02: *** LDSCT:***  No obvious day to day or daytime pattern/variability or assoc excess/ purulent sputum or mucus plugs or hemoptysis or cp or chest tightness, subjective wheeze or overt sinus or hb symptoms.    Also denies any obvious fluctuation of symptoms with weather or environmental changes or other aggravating or alleviating factors except as outlined above   No unusual exposure hx or h/o childhood pna/ asthma or knowledge of premature birth.  Current Allergies, Complete Past Medical History, Past Surgical History, Family History, and Social History were reviewed in [employer] record.  ROS  The following are not active complaints unless bolded Hoarseness, sore throat, dysphagia, dental problems, itching, sneezing,  nasal congestion or discharge of excess mucus or purulent secretions, ear ache,   fever, chills, sweats, unintended wt loss or wt gain, classically pleuritic or exertional cp,  orthopnea pnd or arm/hand swelling  or leg swelling, presyncope, palpitations, abdominal pain, anorexia, nausea, vomiting, diarrhea  or change in bowel habits or change in bladder habits, change in stools or change in urine, dysuria, hematuria,  rash, arthralgias, visual complaints, headache, numbness, weakness or ataxia or problems with walking or coordination,  change in mood or  memory.            Outpatient Medications Prior to Visit  Medication Sig Dispense Refill   Aspirin-Acetaminophen-Caffeine (GOODY HEADACHE PO) Take 1 each by mouth as needed.     buPROPion ER  (WELLBUTRIN SR) 100 MG 12 hr tablet Take 100 mg by mouth. 200mg  every morning & 100mg  every evening     DULoxetine (CYMBALTA) 30 MG capsule Take 30 mg by mouth at bedtime.     JINTELI 1-5 MG-MCG TABS tablet Take 1 tablet by mouth daily.     levocetirizine (XYZAL) 5 MG tablet Take 5 mg by mouth at bedtime.     losartan (COZAAR) 50 MG tablet Take 50 mg by mouth daily.     metoprolol tartrate (LOPRESSOR) 25 MG tablet TAKE 3 TABLETS (75MG ) BY MOUTH TWICE DAILY 180 tablet 6   montelukast (SINGULAIR) 10 MG tablet Take 10 mg by mouth daily.     nitroGLYCERIN (NITROSTAT) 0.4 MG SL tablet Place 1 tablet (0.4 mg total) under the tongue every 5 (five) minutes as needed. 25 tablet 3   XARELTO 20 MG TABS tablet Take 20 mg by mouth daily.     No facility-administered medications prior to visit.    Past Medical History:  Diagnosis Date   Anxiety    Back pain    Chest pain    Complete heart block (HCC)    Headache    Hypertension    Insomnia    Pacemaker    PAF (paroxysmal atrial fibrillation) (HCC)    SOB (shortness of breath)    Vaginal bleeding       Objective:     There were no vitals taken for this visit.         Assessment   No problem-specific Assessment &  Plan notes found for this encounter.     Sandrea Hughs, MD 07/03/2023

## 2023-07-05 ENCOUNTER — Institutional Professional Consult (permissible substitution): Payer: Medicare Other | Admitting: Internal Medicine

## 2023-07-09 IMAGING — US US PELVIS COMPLETE WITH TRANSVAGINAL
1 series · 13 of 25 positions shown · non-contrast
Comparison: 03/10/2021

CLINICAL DATA: LEFT ovarian mass, history of endometrial ablation,
postmenopausal



[Series 1: us pelvic complete with transvaginal · 13 of 94 slices shown]
[im 1/94]
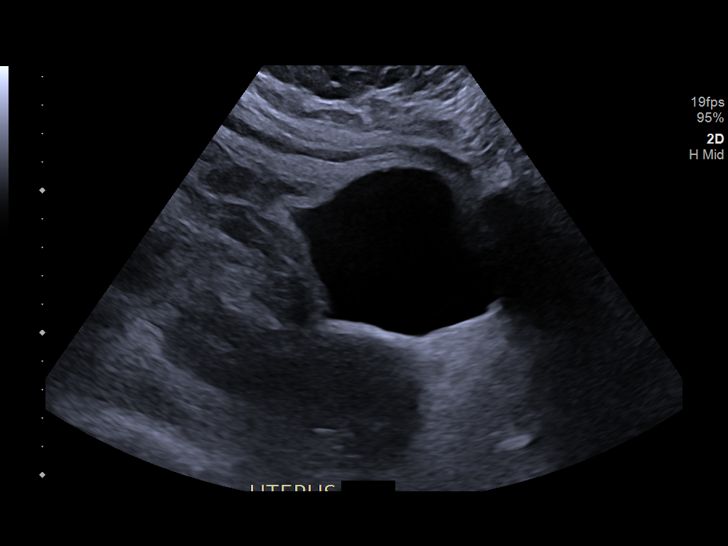
[im 8/94]
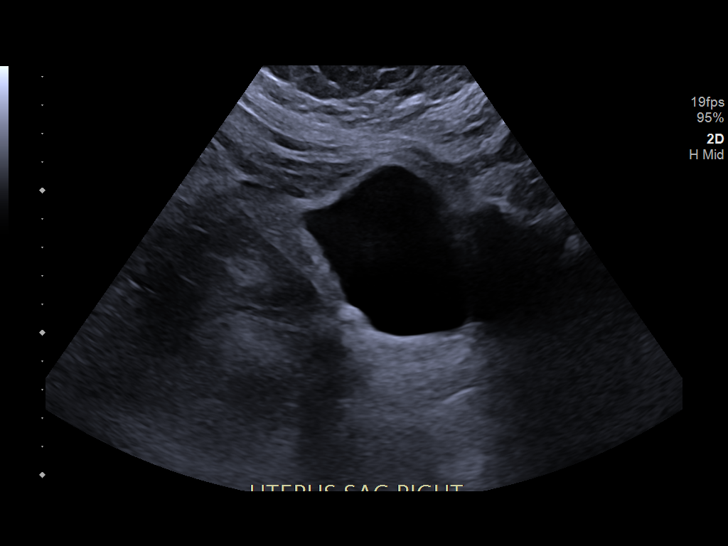
[im 16/94]
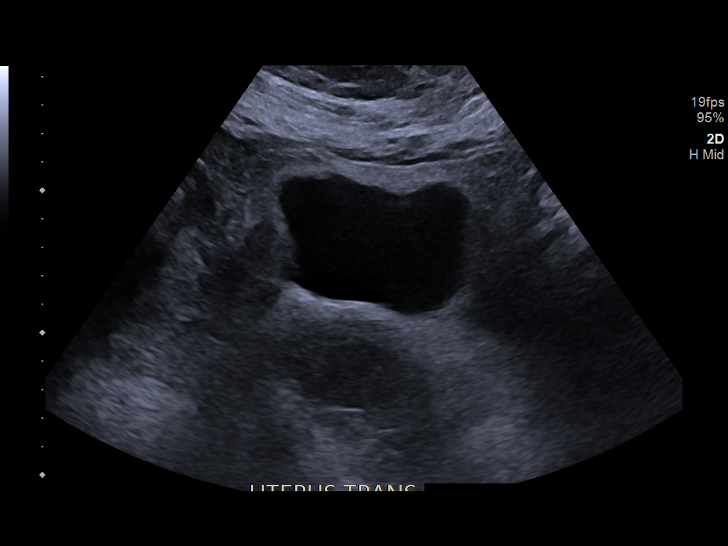
[im 24/94]
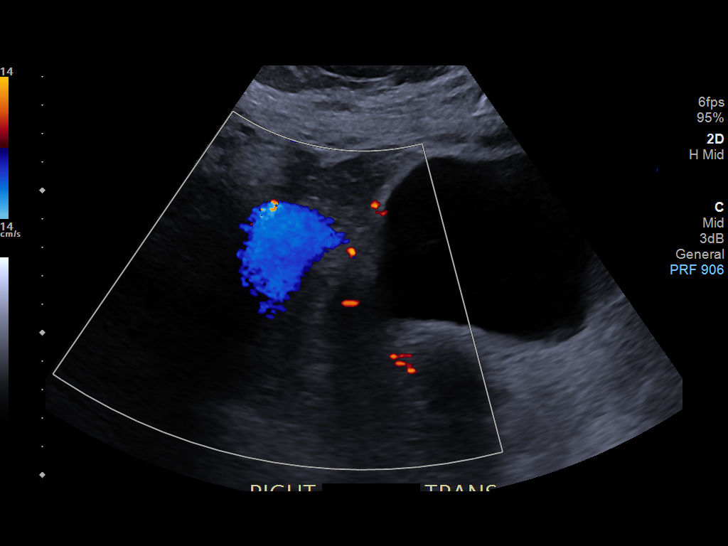
[im 32/94]
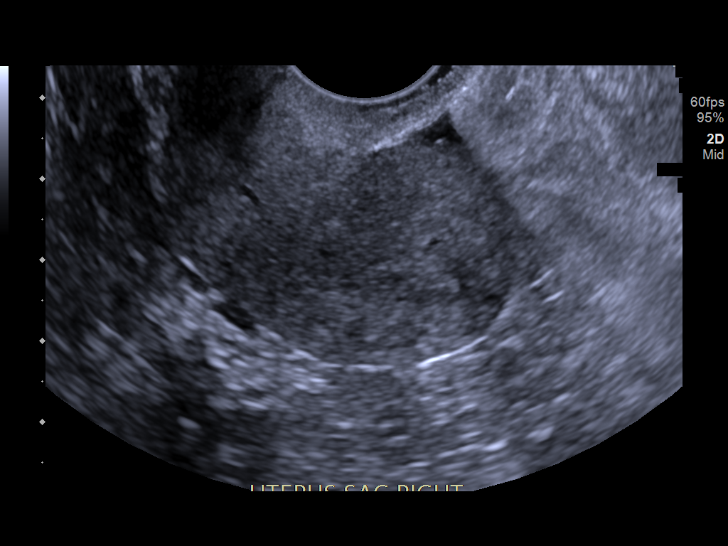
[im 39/94]
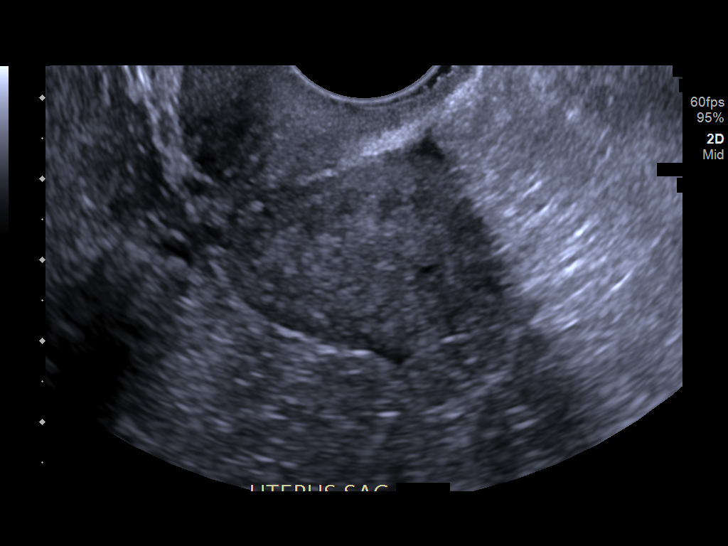
[im 47/94]
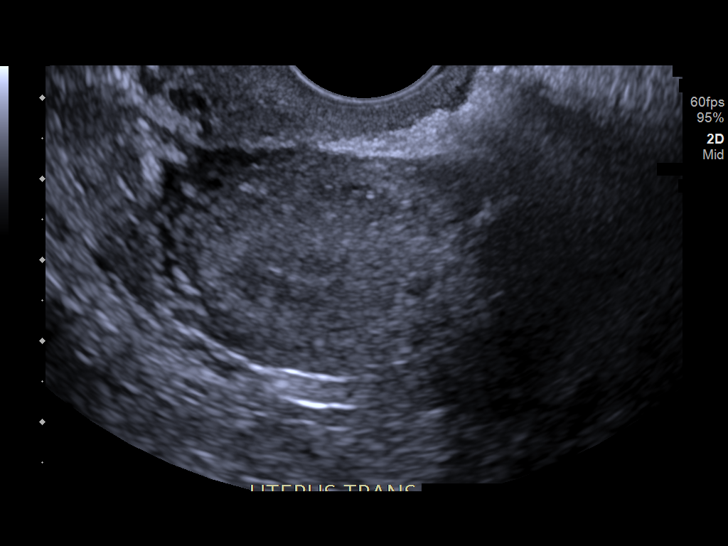
[im 55/94]
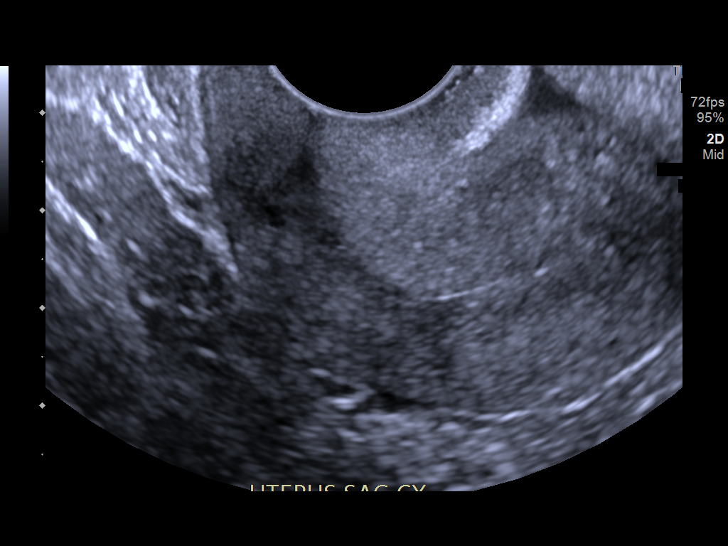
[im 63/94]
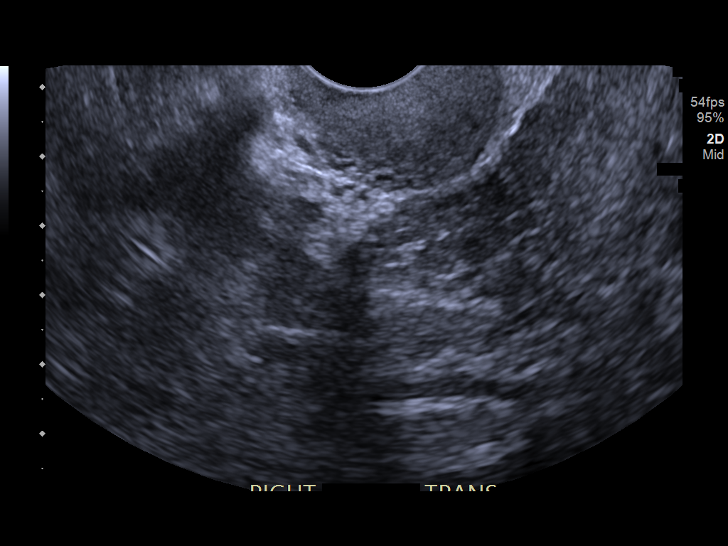
[im 70/94]
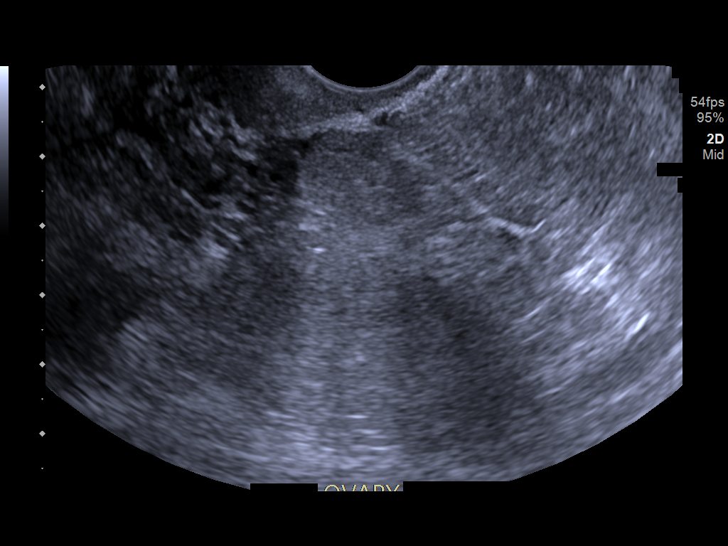
[im 78/94]
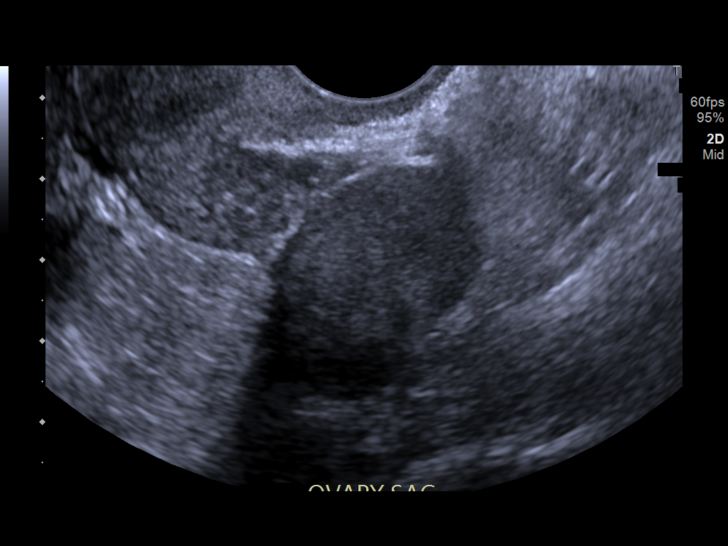
[im 86/94]
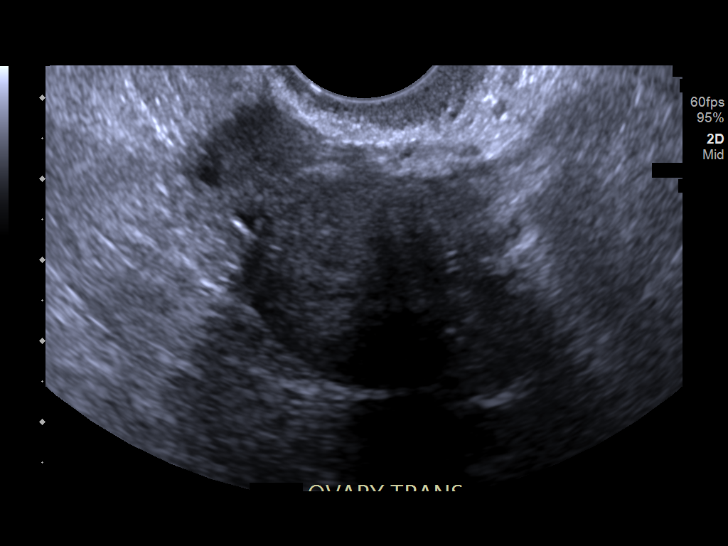
[im 94/94]
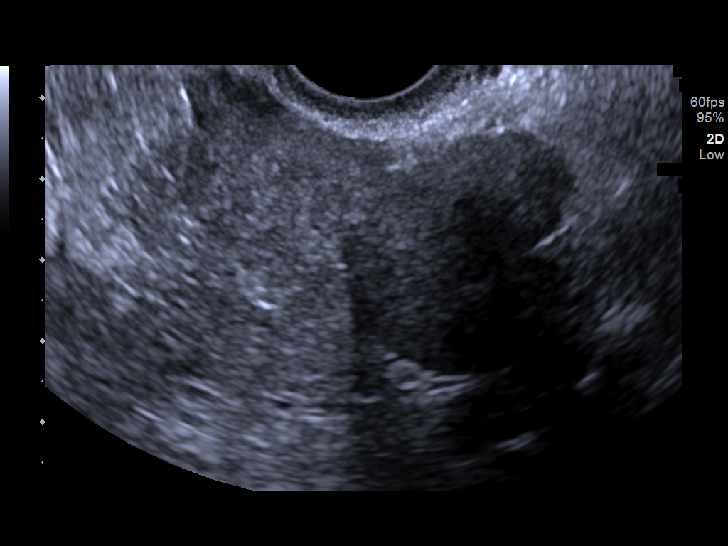

[13 of 25 positions shown; findings below may reference images not displayed]

FINDINGS: Uterus

Measurements: 9.7 x 2.9 x 4.7 cm = volume: 63 mL. Exophytic mass
identified arising from the LEFT lateral aspect of the uterine
fundus, 3.3 x 2.3 x 2.8 cm consistent with exophytic leiomyoma. This
corresponds with the mass identified on the previous exam as well.
No additional uterine masses.

Endometrium

Thickness: 1 mm.  No endometrial fluid or mass

Right ovary

Not visualized, likely obscured by bowel

Left ovary

Not visualized, likely obscured by bowel fall

Other findings

No free pelvic fluid or adnexal masses.
IMPRESSION: Exophytic leiomyoma arising from LEFT lateral aspect of the uterine
fundus, unchanged from previous exam; current exam demonstrates
clear origin from the uterus.

Nonvisualization of ovaries.

No acute abnormalities.

## 2023-07-26 NOTE — Addendum Note (Signed)
 Addended by: Geralyn Flash D on: 07/26/2023 05:00 PM   Modules accepted: Orders

## 2023-07-26 NOTE — Progress Notes (Signed)
 Remote pacemaker transmission.

## 2023-08-01 NOTE — Progress Notes (Unsigned)
 Theresa Neal, female    DOB: 05-Feb-1956    MRN: [id number]   Brief patient profile:  67  yowf  quit smoking in her 50s @ wt  approx 150  referred to pulmonary clinic in [place]  08/05/2023 by Sharlene Dory NP  for doe with pfts 04/06/23 with ERV 24%/ wt 220 lb  nl otherwise       History of Present Illness  08/05/2023  Pulmonary/ 1st office eval/ Theresa Neal / [facility] @[facility]   Cc sob  Dyspnea:  2020 at time of heart block with syncope and sob and gradually worse exam Worse p meals/ sam's x 15 - 20 min / landings / 100 ft incline back to house and 3 stepping  Cough: none even sleeping the  Sleep: bed is flat / one pillows no resp cc    SABA use: albuterol seemed to help during bronchitis / no recent sue  02: none  LDSCT:none   No obvious day to day or daytime pattern/variability or assoc excess/ purulent sputum or mucus plugs or hemoptysis or cp or chest tightness, subjective wheeze or overt sinus or hb symptoms.    Also denies any obvious fluctuation of symptoms with weather or environmental changes or other aggravating or alleviating factors except as outlined above   No unusual exposure hx or h/o childhood pna/ asthma or knowledge of premature birth.  Current Allergies, Complete Past Medical History, Past Surgical History, Family History, and Social History were reviewed in [employer] record.  ROS  The following are not active complaints unless bolded Hoarseness, sore throat, dysphagia, dental problems, itching, sneezing,  nasal congestion or discharge of excess mucus or purulent secretions, ear ache,   fever, chills, sweats, unintended wt loss or wt gain, classically pleuritic or exertional cp,  orthopnea pnd or arm/hand swelling  or leg swelling, presyncope, palpitations, abdominal pain, anorexia, nausea, vomiting, diarrhea  or change in bowel habits or change in bladder habits, change in stools or change in urine, dysuria, hematuria,  rash, arthralgias,  visual complaints, headache, numbness, weakness or ataxia or problems with walking or coordination,  change in mood or  memory.            Outpatient Medications Prior to Visit  Medication Sig Dispense Refill   traZODone (DESYREL) 50 MG tablet Take 50 mg by mouth at bedtime.     Aspirin-Acetaminophen-Caffeine (GOODY HEADACHE PO) Take 1 each by mouth as needed.     buPROPion ER (WELLBUTRIN SR) 100 MG 12 hr tablet Take 100 mg by mouth. 200mg  every morning & 100mg  every evening     DULoxetine (CYMBALTA) 30 MG capsule Take 30 mg by mouth at bedtime.     JINTELI 1-5 MG-MCG TABS tablet Take 1 tablet by mouth daily.     levocetirizine (XYZAL) 5 MG tablet Take 5 mg by mouth at bedtime.     losartan (COZAAR) 50 MG tablet Take 50 mg by mouth daily.     metoprolol tartrate (LOPRESSOR) 25 MG tablet TAKE 3 TABLETS (75MG ) BY MOUTH TWICE DAILY 180 tablet 6   montelukast (SINGULAIR) 10 MG tablet Take 10 mg by mouth daily.     nitroGLYCERIN (NITROSTAT) 0.4 MG SL tablet Place 1 tablet (0.4 mg total) under the tongue every 5 (five) minutes as needed. 25 tablet 3   XARELTO 20 MG TABS tablet Take 20 mg by mouth daily.     No facility-administered medications prior to visit.    Past Medical History:  Diagnosis Date  Anxiety    Back pain    Chest pain    Complete heart block (HCC)    Headache    Hypertension    Insomnia    Pacemaker    PAF (paroxysmal atrial fibrillation) (HCC)    SOB (shortness of breath)    Vaginal bleeding       Objective:     BP 133/80   Pulse 77   Ht 5\' 3"  (1.6 m)   Wt 232 lb (105.2 kg)   SpO2 98%   BMI 41.10 kg/m   SpO2: 98 %  very pleasant jovial amb wf nad freq throat clearing    HEENT : Oropharynx  clear. M1 airway      Nasal turbinates nl    NECK :  without  apparent JVD/ palpable Nodes/TM    LUNGS: no acc muscle use,  Nl contour chest which is clear to A and P bilaterally without cough on insp or exp maneuvers   CV:  RRR  no s3 or murmur or increase  in P2, and no edema   ABD:  obese soft and nontender with slt limited excursion in supine position   MS:  Gait nl   ext warm without deformities Or obvious joint restrictions  calf tenderness, cyanosis or clubbing    SKIN: warm and dry without lesions    NEURO:  alert, approp, nl sensorium with  no motor or cerebellar deficits apparent.        I personally reviewed images and agree with radiology impression as follows:  CXR:   02/21/23 pa and lateral  No active dz  Assessment   No problem-specific Assessment & Plan notes found for this encounter.     Sandrea Hughs, MD 08/05/2023

## 2023-08-05 ENCOUNTER — Ambulatory Visit: Admitting: Internal Medicine

## 2023-08-05 ENCOUNTER — Encounter: Payer: Self-pay | Admitting: Internal Medicine

## 2023-08-05 VITALS — BP 133/80 | HR 77 | Ht 63.0 in | Wt 232.0 lb

## 2023-08-05 DIAGNOSIS — R058 Other specified cough: Secondary | ICD-10-CM | POA: Diagnosis not present

## 2023-08-05 DIAGNOSIS — R0609 Other forms of dyspnea: Secondary | ICD-10-CM | POA: Diagnosis not present

## 2023-08-05 NOTE — Patient Instructions (Addendum)
 Try prilosec otc 20mg   Take 30-60 min before first meal of the day and Pepcid ac (famotidine) 20 mg one @  bedtime until cough/throat clearing are completely gone for at least a week without the need for cough suppression  GERD (REFLUX)  is an extremely common cause of respiratory symptoms just like yours , many times with no obvious heartburn at all.    It can be treated with medication, but also with lifestyle changes including elevation of the head of your bed (ideally with 6-8inch blocks under the headboard of your bed),  Smoking cessation, avoidance of late meals, excessive alcohol, and avoid fatty foods, chocolate, peppermint, colas, red wine, and acidic juices such as orange juice.  NO MINT OR MENTHOL PRODUCTS SO NO COUGH DROPS - LUDEN's ok  USE SUGARLESS CANDY INSTEAD (Jolley ranchers or Stover's or Life Savers) or even ice chips will also do - the key is to swallow to prevent all throat clearing. NO OIL BASED VITAMINS - use powdered substitutes.  Avoid fish oil when coughing.    To get the most out of exercise, you need to be continuously aware that you are short of breath, but never out of breath, for at least 30 minutes daily. As you improve, it will actually be easier for you to do the same amount of exercise  in  30 minutes so always push to the level where you are short of breath.      Next step is CPST in [place] if desired (exercise test while hooked to computer)

## 2023-08-05 NOTE — Assessment & Plan Note (Signed)
 Quit smoking in her 50s at wt 150 and gradual wt gain/ proprotionate doe since  - PFTS 04/06/23 with ERV 24%/ wt 220 lb  nl otherwise   -  08/05/2023 @232   lb  Walked on RA  x  3  lap(s) =  approx 450  ft  @ brisk pace, stopped due to end of study  with lowest 02 sats 98% and mild sob at end    Classic pattern of doe related to wt gain and do not rec any further w/u until she tries regular sub max ex and gerd rx  (see uacs)   If not making progress then next step is CPST in [place]

## 2023-08-06 DIAGNOSIS — R058 Other specified cough: Secondary | ICD-10-CM | POA: Insufficient documentation

## 2023-08-06 NOTE — Assessment & Plan Note (Addendum)
 04/06/23 with ERV 24%/ wt 220 lb  nl otherwise    Body mass index is 41.1 kg/m.    Lab Results  Component Value Date   TSH 1.752 05/29/2022    Contributing to doe and risk of GERD/ dvt/ PE >>>   reviewed the need and the process to achieve and maintain neg calorie balance > defer f/u primary care including intermittently monitoring thyroid status      Each maintenance medication was reviewed in detail including emphasizing most importantly the difference between maintenance and prns and under what circumstances the prns are to be triggered using an action plan format where appropriate.  Total time for H and P, chart review, counseling,  directly observing portions of ambulatory 02 saturation study/ and generating customized AVS unique to this office visit / same day charting = 50 min with pt new to me for  multiple  refractory respiratory  symptoms of uncertain etiology

## 2023-08-06 NOTE — Assessment & Plan Note (Signed)
?   With wt gain since quit smoking in her 33s - 08/05/2023 rec max otc gerd rx then ? ENT eval if not better   Upper airway cough syndrome (previously labeled PNDS),  is so named because it's frequently impossible to sort out how much is  CR/sinusitis with freq throat clearing (which can be related to primary GERD)   vs  causing  secondary (" extra esophageal")  GERD from wide swings in gastric pressure that occur with throat clearing, often  promoting self use of mint and menthol lozenges that reduce the lower esophageal sphincter tone and exacerbate the problem further in a cyclical fashion.   These are the same pts (now being labeled as having "irritable larynx syndrome" by some cough centers) who not infrequently have a history of having failed to tolerate ace inhibitors,  dry powder inhalers or biphosphonates or report having atypical/extraesophageal reflux symptoms(LPR)  that don't respond to standard doses of PPI  and are easily confused as having aecopd or asthma flares by even experienced allergists/ pulmonologists (myself included).

## 2023-09-14 ENCOUNTER — Ambulatory Visit (INDEPENDENT_AMBULATORY_CARE_PROVIDER_SITE_OTHER): Payer: 59

## 2023-09-14 DIAGNOSIS — I442 Atrioventricular block, complete: Secondary | ICD-10-CM

## 2023-09-15 LAB — CUP PACEART REMOTE DEVICE CHECK
Battery Remaining Longevity: 70 mo
Battery Voltage: 2.96 V
Brady Statistic AP VP Percent: 4.4 %
Brady Statistic AP VS Percent: 0.01 %
Brady Statistic AS VP Percent: 94.85 %
Brady Statistic AS VS Percent: 0.74 %
Brady Statistic RA Percent Paced: 4.71 %
Brady Statistic RV Percent Paced: 99.24 %
Date Time Interrogation Session: 20250513043727
Implantable Lead Connection Status: 753985
Implantable Lead Connection Status: 753985
Implantable Lead Connection Status: 753985
Implantable Lead Implant Date: 20201116
Implantable Lead Implant Date: 20201116
Implantable Lead Implant Date: 20201116
Implantable Lead Location: 753858
Implantable Lead Location: 753859
Implantable Lead Location: 753860
Implantable Lead Model: 4798
Implantable Lead Model: 5076
Implantable Lead Model: 5076
Implantable Pulse Generator Implant Date: 20201116
Lead Channel Impedance Value: 342 Ohm
Lead Channel Impedance Value: 437 Ohm
Lead Channel Impedance Value: 456 Ohm
Lead Channel Impedance Value: 494 Ohm
Lead Channel Impedance Value: 513 Ohm
Lead Channel Impedance Value: 513 Ohm
Lead Channel Impedance Value: 513 Ohm
Lead Channel Impedance Value: 551 Ohm
Lead Channel Impedance Value: 570 Ohm
Lead Channel Impedance Value: 817 Ohm
Lead Channel Impedance Value: 817 Ohm
Lead Channel Impedance Value: 817 Ohm
Lead Channel Impedance Value: 874 Ohm
Lead Channel Impedance Value: 893 Ohm
Lead Channel Pacing Threshold Amplitude: 0.5 V
Lead Channel Pacing Threshold Amplitude: 0.625 V
Lead Channel Pacing Threshold Amplitude: 2.375 V
Lead Channel Pacing Threshold Pulse Width: 0.4 ms
Lead Channel Pacing Threshold Pulse Width: 0.4 ms
Lead Channel Pacing Threshold Pulse Width: 0.4 ms
Lead Channel Sensing Intrinsic Amplitude: 0.75 mV
Lead Channel Sensing Intrinsic Amplitude: 0.75 mV
Lead Channel Sensing Intrinsic Amplitude: 20.25 mV
Lead Channel Sensing Intrinsic Amplitude: 20.25 mV
Lead Channel Setting Pacing Amplitude: 1.5 V
Lead Channel Setting Pacing Amplitude: 2 V
Lead Channel Setting Pacing Amplitude: 3 V
Lead Channel Setting Pacing Pulse Width: 0.4 ms
Lead Channel Setting Pacing Pulse Width: 0.4 ms
Lead Channel Setting Sensing Sensitivity: 1.2 mV
Zone Setting Status: 755011
Zone Setting Status: 755011

## 2023-09-17 ENCOUNTER — Ambulatory Visit: Payer: Self-pay | Admitting: Cardiovascular Disease

## 2023-11-01 NOTE — Addendum Note (Signed)
 Addended by: VICCI SELLER A on: 11/01/2023 10:45 AM   Modules accepted: Orders

## 2023-11-01 NOTE — Progress Notes (Signed)
 Remote pacemaker transmission.

## 2023-12-03 ENCOUNTER — Ambulatory Visit: Payer: Medicare Other | Attending: Cardiovascular Disease | Admitting: Cardiovascular Disease

## 2023-12-03 ENCOUNTER — Encounter: Payer: Self-pay | Admitting: Cardiovascular Disease

## 2023-12-03 VITALS — BP 124/82 | HR 75 | Ht 63.0 in | Wt 240.0 lb

## 2023-12-03 DIAGNOSIS — I442 Atrioventricular block, complete: Secondary | ICD-10-CM

## 2023-12-03 DIAGNOSIS — Z7901 Long term (current) use of anticoagulants: Secondary | ICD-10-CM

## 2023-12-03 DIAGNOSIS — I10 Essential (primary) hypertension: Secondary | ICD-10-CM | POA: Diagnosis not present

## 2023-12-03 DIAGNOSIS — Z79899 Other long term (current) drug therapy: Secondary | ICD-10-CM | POA: Diagnosis not present

## 2023-12-03 LAB — CUP PACEART INCLINIC DEVICE CHECK
Date Time Interrogation Session: 20250801103934
Implantable Lead Connection Status: 753985
Implantable Lead Connection Status: 753985
Implantable Lead Connection Status: 753985
Implantable Lead Implant Date: 20201116
Implantable Lead Implant Date: 20201116
Implantable Lead Implant Date: 20201116
Implantable Lead Location: 753858
Implantable Lead Location: 753859
Implantable Lead Location: 753860
Implantable Lead Model: 4798
Implantable Lead Model: 5076
Implantable Lead Model: 5076
Implantable Pulse Generator Implant Date: 20201116

## 2023-12-03 NOTE — Progress Notes (Signed)
  Electrophysiology Office Note:    Date:  12/03/2023   ID:  Astrid Vides, DOB [birth year], MRN [id number]  PCP:  Dow Longs, PA-C   [facility] Providers Cardiologist:  Alvan Carrier, MD Cardiology APP:  Miriam Norris, NP  Electrophysiologist:  Eulas FORBES Furbish, MD     Referring MD: Dow Longs, PA-C   History of Present Illness:    Theresa Neal is a 68 y.o. female with a medical history significant for complete heart block with Medtronic CRT pacemaker, who presents for electrophysiology follow-up.     She has a Medtronic CRT pacemaker placed in [place] in November 2020 due to complete heart block.     Today, she reports that she is doing very well. she has no device related complaints -- no new tenderness, drainage, redness.   EKGs/Labs/Other Studies Reviewed Today:    Echocardiogram:  TTE October 06, 2021 EF 60 to 65%.  Normal biatrial size  TTE March 10, 2023 EF 60 to 65%.  Normal biatrial size.  Grade 1 diastolic dysfunction.  Mild mitral valve regurgitation.   Monitors:   Stress testing:   Advanced imaging:   Cardiac catherization   EKG:   EKG Interpretation Date/Time:  Friday December 03 2023 09:21:59 EDT Ventricular Rate:  75 PR Interval:  144 QRS Duration:  120 QT Interval:  472 QTC Calculation: 527 R Axis:   264  Text Interpretation: Normal sinus rhythm ;  atrial-sensed, Ventricular-paced rhythm When compared with ECG of 24-May-2023 09:38, No signfiicant change Confirmed by Furbish Eulas [phone]) on 12/03/2023 9:28:35 AM     Physical Exam:    VS:  BP 124/82 (BP Location: Right Arm, Cuff Size: Large)   Pulse 75   Ht 5' 3 (1.6 m)   Wt 240 lb (108.9 kg)   SpO2 97%   BMI 42.51 kg/m     Wt Readings from Last 3 Encounters:  12/03/23 240 lb (108.9 kg)  08/05/23 232 lb (105.2 kg)  05/24/23 234 lb (106.1 kg)     GEN:  Well nourished, well developed in no acute distress CARDIAC: RRR, no murmurs, rubs, gallops The device  site is normal -- no tenderness, edema, drainage, redness, threatened erosion.  RESPIRATORY:  Normal work of breathing MUSCULOSKELETAL: no edema  Device interrogation - reviewed in detail today.  See Paceart report.  ASSESSMENT & PLAN:    Complete heart block Normal BiV pacemaker function; 100% BiV paced See Paceart report RV threshold trending up -- outputs reprogrammed today She is device dependent today  Paroxysmal atrial fibrillation No atrial fibrillation events this interrogation  Secondary hypercoagulable state On Xarelto 15 for CHADS2 VASc score of 3   Will order CBC and BMP     Signed, Eulas FORBES Furbish, MD  12/03/2023 9:45 AM    [facility]

## 2023-12-03 NOTE — Patient Instructions (Signed)
 Medication Instructions:   Continue all current medications.   Labwork:  BMET, CBC - orders given  Office will contact with results via phone, letter or mychart.     Testing/Procedures:  none  Follow-Up:  Your physician wants you to follow up in:  1 year.  You should receive a recall letter in the mail about 2 months prior to the time you are due.  If you don't receive this, please call our office to schedule your follow up appointment.      Any Other Special Instructions Will Be Listed Below (If Applicable).   If you need a refill on your cardiac medications before your next appointment, please call your pharmacy.

## 2023-12-03 NOTE — Addendum Note (Signed)
 Addended by: Rayne Cowdrey G on: 12/03/2023 10:04 AM   Modules accepted: Orders

## 2023-12-08 DIAGNOSIS — R42 Dizziness and giddiness: Secondary | ICD-10-CM | POA: Diagnosis not present

## 2023-12-08 DIAGNOSIS — R5383 Other fatigue: Secondary | ICD-10-CM | POA: Diagnosis not present

## 2023-12-08 DIAGNOSIS — R739 Hyperglycemia, unspecified: Secondary | ICD-10-CM | POA: Diagnosis not present

## 2023-12-08 DIAGNOSIS — I10 Essential (primary) hypertension: Secondary | ICD-10-CM | POA: Diagnosis not present

## 2023-12-09 ENCOUNTER — Encounter: Payer: Self-pay | Admitting: Family Medicine

## 2023-12-14 ENCOUNTER — Ambulatory Visit (INDEPENDENT_AMBULATORY_CARE_PROVIDER_SITE_OTHER): Payer: 59

## 2023-12-14 ENCOUNTER — Ambulatory Visit: Payer: Self-pay | Admitting: Cardiovascular Disease

## 2023-12-14 DIAGNOSIS — I442 Atrioventricular block, complete: Secondary | ICD-10-CM | POA: Diagnosis not present

## 2023-12-15 DIAGNOSIS — Z950 Presence of cardiac pacemaker: Secondary | ICD-10-CM | POA: Diagnosis not present

## 2023-12-15 DIAGNOSIS — R0602 Shortness of breath: Secondary | ICD-10-CM | POA: Diagnosis not present

## 2023-12-15 DIAGNOSIS — Z0000 Encounter for general adult medical examination without abnormal findings: Secondary | ICD-10-CM | POA: Diagnosis not present

## 2023-12-15 DIAGNOSIS — I10 Essential (primary) hypertension: Secondary | ICD-10-CM | POA: Diagnosis not present

## 2023-12-15 LAB — CUP PACEART REMOTE DEVICE CHECK
Battery Remaining Longevity: 56 mo
Battery Voltage: 2.96 V
Brady Statistic AP VP Percent: 3.68 %
Brady Statistic AP VS Percent: 0.01 %
Brady Statistic AS VP Percent: 95.75 %
Brady Statistic AS VS Percent: 0.56 %
Brady Statistic RA Percent Paced: 4.02 %
Brady Statistic RV Percent Paced: 99.43 %
Date Time Interrogation Session: 20250812111220
Implantable Lead Connection Status: 753985
Implantable Lead Connection Status: 753985
Implantable Lead Connection Status: 753985
Implantable Lead Implant Date: 20201116
Implantable Lead Implant Date: 20201116
Implantable Lead Implant Date: 20201116
Implantable Lead Location: 753858
Implantable Lead Location: 753859
Implantable Lead Location: 753860
Implantable Lead Model: 4798
Implantable Lead Model: 5076
Implantable Lead Model: 5076
Implantable Pulse Generator Implant Date: 20201116
Lead Channel Impedance Value: 323 Ohm
Lead Channel Impedance Value: 475 Ohm
Lead Channel Impedance Value: 494 Ohm
Lead Channel Impedance Value: 494 Ohm
Lead Channel Impedance Value: 513 Ohm
Lead Channel Impedance Value: 532 Ohm
Lead Channel Impedance Value: 551 Ohm
Lead Channel Impedance Value: 570 Ohm
Lead Channel Impedance Value: 608 Ohm
Lead Channel Impedance Value: 855 Ohm
Lead Channel Impedance Value: 912 Ohm
Lead Channel Impedance Value: 912 Ohm
Lead Channel Impedance Value: 950 Ohm
Lead Channel Impedance Value: 950 Ohm
Lead Channel Pacing Threshold Amplitude: 0.5 V
Lead Channel Pacing Threshold Amplitude: 0.625 V
Lead Channel Pacing Threshold Amplitude: 2.75 V
Lead Channel Pacing Threshold Pulse Width: 0.4 ms
Lead Channel Pacing Threshold Pulse Width: 0.4 ms
Lead Channel Pacing Threshold Pulse Width: 0.6 ms
Lead Channel Sensing Intrinsic Amplitude: 1 mV
Lead Channel Sensing Intrinsic Amplitude: 1 mV
Lead Channel Sensing Intrinsic Amplitude: 20.25 mV
Lead Channel Sensing Intrinsic Amplitude: 20.25 mV
Lead Channel Setting Pacing Amplitude: 1.5 V
Lead Channel Setting Pacing Amplitude: 2 V
Lead Channel Setting Pacing Amplitude: 3.25 V
Lead Channel Setting Pacing Pulse Width: 0.4 ms
Lead Channel Setting Pacing Pulse Width: 0.6 ms
Lead Channel Setting Sensing Sensitivity: 1.2 mV
Zone Setting Status: 755011
Zone Setting Status: 755011

## 2023-12-19 ENCOUNTER — Ambulatory Visit: Payer: Self-pay | Admitting: Cardiovascular Disease

## 2024-01-27 NOTE — Progress Notes (Signed)
 Remote PPM Transmission

## 2024-02-02 ENCOUNTER — Encounter: Payer: Self-pay | Admitting: Cardiology

## 2024-02-02 ENCOUNTER — Ambulatory Visit: Attending: Cardiology | Admitting: Cardiology

## 2024-02-02 VITALS — BP 120/72 | HR 86 | Ht 63.0 in | Wt 237.0 lb

## 2024-02-02 DIAGNOSIS — R0989 Other specified symptoms and signs involving the circulatory and respiratory systems: Secondary | ICD-10-CM

## 2024-02-02 DIAGNOSIS — I2510 Atherosclerotic heart disease of native coronary artery without angina pectoris: Secondary | ICD-10-CM | POA: Diagnosis not present

## 2024-02-02 DIAGNOSIS — E782 Mixed hyperlipidemia: Secondary | ICD-10-CM

## 2024-02-02 DIAGNOSIS — I10 Essential (primary) hypertension: Secondary | ICD-10-CM | POA: Diagnosis not present

## 2024-02-02 DIAGNOSIS — I442 Atrioventricular block, complete: Secondary | ICD-10-CM | POA: Diagnosis not present

## 2024-02-02 MED ORDER — ATORVASTATIN CALCIUM 20 MG PO TABS
20.0000 mg | ORAL_TABLET | Freq: Every day | ORAL | 3 refills | Status: AC
Start: 2024-02-02 — End: 2024-05-02

## 2024-02-02 NOTE — Patient Instructions (Signed)
 Medication Instructions:  Your physician has recommended you make the following change in your medication:   -Start Atorvastatin (Lipitor) 20 mg once daily   *If you need a refill on your cardiac medications before your next appointment, please call your pharmacy*  Lab Work: In 2 Months: - FASTING Lipid Panel- nothing to eat/drink at least 6 hours prior to having labs drawn.  If you have labs (blood work) drawn today and your tests are completely normal, you will receive your results only by: MyChart Message (if you have MyChart) OR A paper copy in the mail If you have any lab test that is abnormal or we need to change your treatment, we will call you to review the results.  Testing/Procedures: Your physician has requested that you have a carotid duplex. This test is an ultrasound of the carotid arteries in your neck. It looks at blood flow through these arteries that supply the brain with blood. Allow one hour for this exam. There are no restrictions or special instructions.   Follow-Up: At [facility], you and your health needs are our priority.  As part of our continuing mission to provide you with exceptional heart care, our providers are all part of one team.  This team includes your primary Cardiologist (physician) and Advanced Practice Providers or APPs (Physician Assistants and Nurse Practitioners) who all work together to provide you with the care you need, when you need it.  Your next appointment:   6 month(s)  Provider:   You may see Alvan Carrier, MD or one of the following Advanced Practice Providers on your designated Care Team:   Almarie Crate, NP  We recommend signing up for the patient portal called MyChart.  Sign up information is provided on this After Visit Summary.  MyChart is used to connect with patients for Virtual Visits (Telemedicine).  Patients are able to view lab/test results, encounter notes, upcoming appointments, etc.  Non-urgent messages  can be sent to your provider as well.   To learn more about what you can do with MyChart, go to [email or web address].   Other Instructions

## 2024-02-02 NOTE — Progress Notes (Signed)
 Clinical Summary Theresa Neal is a 68 y.o.female seen today for follow up of the following medical problems.    Prevoiusly followed by cardiology at [facility]     Afib - no recent palpitations - compliant with meds. No bleeding on xarelto.      2.Complete heart block with pacemaker - from notes medtronic BiV pacemaker, CRT-P - normal device check 12/2023    3. HTN - compliant with meds     4. OSA - follwed by pulmonary 2024 sleep study mild, she reports pulmonary told her does not need cpap     5. SOB - comes and goes.  - reports symptoms in October while walking at the beach. Some associated chest pains at that time - former smoker x 20-30years.      - 1-2 months ago [place] with grandchildren. Significant DOE. Heaviness in chest, does not recall location of chest pain. Lasted a few minutes.  - seen by [facility] pullmonology, reprts PFTs there - no recent edema.    03/2023 echo: LVEF 60-65%, no WMAs, grade I dd, normal RV function  12/2021 coronary CTA: coronary calcium score 29, mid LAD 50-69% not significant by FFR  -PFTs mild obstruction, minimal diffusion defect    6. CAD - 12/2021 coronary CTA: coronary calcium score 29, mid LAD 50-69% not significant by FFR - no chest pains       Husband passed in October Upcoming cruise to [place] with her family Past Medical History:  Diagnosis Date   Anxiety    Back pain    Chest pain    Complete heart block (HCC)    Headache    Hypertension    Insomnia    Pacemaker    PAF (paroxysmal atrial fibrillation) (HCC)    SOB (shortness of breath)    Vaginal bleeding      No Known Allergies   Current Outpatient Medications  Medication Sig Dispense Refill   Aspirin-Acetaminophen -Caffeine (GOODY HEADACHE PO) Take 1 each by mouth as needed.     buPROPion ER (WELLBUTRIN SR) 100 MG 12 hr tablet Take 100 mg by mouth. 200mg  every morning & 100mg  every evening     DULoxetine (CYMBALTA) 30 MG  capsule Take 30 mg by mouth at bedtime.     JINTELI 1-5 MG-MCG TABS tablet Take 1 tablet by mouth daily.     levocetirizine (XYZAL) 5 MG tablet Take 5 mg by mouth at bedtime.     losartan (COZAAR) 50 MG tablet Take 50 mg by mouth daily.     metoprolol  tartrate (LOPRESSOR ) 25 MG tablet TAKE 3 TABLETS (75MG ) BY MOUTH TWICE DAILY 180 tablet 6   montelukast (SINGULAIR) 10 MG tablet Take 10 mg by mouth daily.     nitroGLYCERIN  (NITROSTAT ) 0.4 MG SL tablet Place 1 tablet (0.4 mg total) under the tongue every 5 (five) minutes as needed. 25 tablet 3   traZODone  (DESYREL ) 50 MG tablet Take 50 mg by mouth at bedtime.     XARELTO 20 MG TABS tablet Take 20 mg by mouth daily.     No current facility-administered medications for this visit.     Past Surgical History:  Procedure Laterality Date   BREAST BIOPSY Left    benign   BREAST BIOPSY Left 02/03/2023   MM LT BREAST BX W LOC DEV 1ST LESION IMAGE BX SPEC STEREO GUIDE 02/03/2023 [facility]MOGR[facility]   CHOLECYSTECTOMY  06/2015   PACEMAKER IMPLANT Left 03/20/2019   MDT Everlene Danae  CRT-P implanted in [facility] by Dr Drew for complete heart block   TUBAL LIGATION  1985     No Known Allergies    Family History  Problem Relation Age of Onset   Lung disease Mother      Social History Ms. Fodor reports that she has quit smoking. Her smoking use included cigarettes. She has never used smokeless tobacco. Ms. Ressel reports current alcohol use.    Physical Examination Today's Vitals   02/02/24 1030  BP: 120/72  Pulse: 86  SpO2: 99%  Weight: 237 lb (107.5 kg)  Height: 5' 3 (1.6 m)   Body mass index is 41.98 kg/m.  Gen: resting comfortably, no acute distress HEENT: no scleral icterus, pupils equal round and reactive, no palptable cervical adenopathy,  CV: RRR, no m/rg, no jvd. +left carotid bruit Resp: Clear to auscultation bilaterally GI: abdomen is soft, non-tender, non-distended, normal bowel sounds, no  hepatosplenomegaly MSK: extremities are warm, no edema.  Skin: warm, no rash Neuro:  no focal deficits Psych: appropriate affect   Diagnostic Studies  11/2019 echo 1. Overall left ventricular ejection fraction is estimated at 55 to 60%.   2. Normal global left ventricular systolic function.   3. Normal left ventricular diastolic filling.   4. There is no evidence of pericardial effusion.   5. Mild mitral annular calcification.   6. Limited TTE.   7. No intracardiac thrombi, mass or vegetations.  '   03/2019 cMRI  1. 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 No evidence of left ventricular intracavitary thrombi or myocardial scar/fibrosis, edema/inflammation, diffuse myocardial infiltration or increased iron content.   2. Normal right ventricular cavity size and systolic function without conclusive evidence of late contrast enhancement or features of arrhythmogenic dysplasia.   3. Moderate left atrial and mild right atrial dilatation.   03/2019 echo   Summary   1. Overall left ventricular ejection fraction is estimated at 45 to 50%.   2. Mildly decreased global left ventricular systolic function.   3. Mild mitral valve regurgitation.   4. Mild thickening of the anterior and posterior mitral valve leaflets.    11/2019 nuclear stress IMPRESSION:  SPECT imaging shows normal perfusion and function.  EF is normal at >65% (visually).  No transient ischemic dilatation.  Paced rhythm.  No extracardiac tracer uptake noted.  Septal and lateral hot spots suggestive of Left ventricular hypertrophy.       12/2021 coronary CTA IMPRESSION: 1. Coronary calcium score of 29. This was 72nd percentile for age and sex matched control.   2. Normal coronary origin with left dominance.   3. Noncalcified plaque in mid LAD causes moderate (50-69%) stenosis.   4. Distal LCX poorly visualized due to artifact from BiV  pacemaker Lead  FFR  1. Left Main: No significant stenosis   2. LAD: CTFFR 0.84 across lesion in mid LAD, suggesting lesion is not hemodynamically significant   3. LCX:Not modeled due to artifact   4. RCA: CTFFR 0.82 across lesion in proximal RCA. RCA is small and nondominant   IMPRESSION: 1. CTFFR 0.84 across lesion in mid LAD, suggesting lesion is not hemodynamically significant   2. LCX not modeled due to artifact from device  Assessment and Plan  1.Afib/acquried thrombophilia - no recent symptoms, continue current meds   2.Complete heart block/Pacemaker - has medtrocinic BiV pacemaker, follwoed by EP - recent normal device check, continue to monitor   3. HTN - bp  at goal, continue current meds   4.SOB - chronic symptoms, benign cardiac testing with echo and coronary CTA - followed by pulmonary - work on increased exercise/activity, weight loss - no further cardiac testing indicated at this time.   5. Carotid bruit - noted on exam, will order carotid US   6. 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 LDL was 110, based on CAD should be on statin. Start atorvastatin 20mg  daily, check lipid panel 2 months    Dorn PHEBE Ross, M.D.

## 2024-02-17 ENCOUNTER — Ambulatory Visit: Attending: Cardiology

## 2024-02-17 DIAGNOSIS — R0989 Other specified symptoms and signs involving the circulatory and respiratory systems: Secondary | ICD-10-CM | POA: Diagnosis not present

## 2024-02-20 ENCOUNTER — Other Ambulatory Visit: Payer: Self-pay | Admitting: Cardiology

## 2024-03-10 ENCOUNTER — Ambulatory Visit: Payer: Self-pay | Admitting: Cardiology

## 2024-03-10 NOTE — Telephone Encounter (Signed)
 Pt returning call to nurse

## 2024-03-14 ENCOUNTER — Ambulatory Visit: Payer: 59

## 2024-03-14 DIAGNOSIS — I442 Atrioventricular block, complete: Secondary | ICD-10-CM

## 2024-03-14 LAB — CUP PACEART REMOTE DEVICE CHECK
Battery Remaining Longevity: 50 mo
Battery Voltage: 2.95 V
Brady Statistic AP VP Percent: 5.17 %
Brady Statistic AP VS Percent: 0.01 %
Brady Statistic AS VP Percent: 94.41 %
Brady Statistic AS VS Percent: 0.41 %
Brady Statistic RA Percent Paced: 5.29 %
Brady Statistic RV Percent Paced: 99.57 %
Date Time Interrogation Session: 20251110194535
Implantable Lead Connection Status: 753985
Implantable Lead Connection Status: 753985
Implantable Lead Connection Status: 753985
Implantable Lead Implant Date: 20201116
Implantable Lead Implant Date: 20201116
Implantable Lead Implant Date: 20201116
Implantable Lead Location: 753858
Implantable Lead Location: 753859
Implantable Lead Location: 753860
Implantable Lead Model: 4798
Implantable Lead Model: 5076
Implantable Lead Model: 5076
Implantable Pulse Generator Implant Date: 20201116
Lead Channel Impedance Value: 323 Ohm
Lead Channel Impedance Value: 399 Ohm
Lead Channel Impedance Value: 475 Ohm
Lead Channel Impedance Value: 494 Ohm
Lead Channel Impedance Value: 494 Ohm
Lead Channel Impedance Value: 551 Ohm
Lead Channel Impedance Value: 570 Ohm
Lead Channel Impedance Value: 570 Ohm
Lead Channel Impedance Value: 608 Ohm
Lead Channel Impedance Value: 855 Ohm
Lead Channel Impedance Value: 855 Ohm
Lead Channel Impedance Value: 912 Ohm
Lead Channel Impedance Value: 931 Ohm
Lead Channel Impedance Value: 950 Ohm
Lead Channel Pacing Threshold Amplitude: 0.5 V
Lead Channel Pacing Threshold Amplitude: 0.5 V
Lead Channel Pacing Threshold Amplitude: 2.75 V
Lead Channel Pacing Threshold Pulse Width: 0.4 ms
Lead Channel Pacing Threshold Pulse Width: 0.4 ms
Lead Channel Pacing Threshold Pulse Width: 0.6 ms
Lead Channel Sensing Intrinsic Amplitude: 0.375 mV
Lead Channel Sensing Intrinsic Amplitude: 0.375 mV
Lead Channel Sensing Intrinsic Amplitude: 20.25 mV
Lead Channel Sensing Intrinsic Amplitude: 20.25 mV
Lead Channel Setting Pacing Amplitude: 1.5 V
Lead Channel Setting Pacing Amplitude: 2 V
Lead Channel Setting Pacing Amplitude: 3.25 V
Lead Channel Setting Pacing Pulse Width: 0.4 ms
Lead Channel Setting Pacing Pulse Width: 0.6 ms
Lead Channel Setting Sensing Sensitivity: 1.2 mV
Zone Setting Status: 755011
Zone Setting Status: 755011

## 2024-03-15 ENCOUNTER — Ambulatory Visit: Payer: Self-pay | Admitting: Cardiovascular Disease

## 2024-03-17 NOTE — Progress Notes (Signed)
 Remote PPM Transmission

## 2024-04-04 LAB — LIPID PANEL
Chol/HDL Ratio: 2 ratio (ref 0.0–4.4)
Cholesterol, Total: 135 mg/dL (ref 100–199)
HDL: 69 mg/dL (ref >39)
LDL Chol Calc (NIH): 53 mg/dL (ref 0–99)
Triglycerides: 62 mg/dL (ref 0–149)
VLDL Cholesterol Cal: 13 mg/dL (ref 5–40)

## 2024-06-13 ENCOUNTER — Ambulatory Visit: Payer: 59

## 2024-07-06 ENCOUNTER — Ambulatory Visit: Admitting: Cardiology

## 2024-09-12 ENCOUNTER — Ambulatory Visit: Payer: 59

## 2024-12-12 ENCOUNTER — Ambulatory Visit: Payer: 59

## 2025-03-13 ENCOUNTER — Ambulatory Visit: Payer: 59
